# Patient Record
Sex: Male | Born: 1970 | Race: White | Hispanic: No | Marital: Married | Smoking: Never smoker
Health system: Southern US, Community
[De-identification: ages and names within clinical notes are randomized; demographics above are authoritative.]

## PROBLEM LIST (undated history)

## (undated) DIAGNOSIS — F419 Anxiety disorder, unspecified: Secondary | ICD-10-CM

## (undated) DIAGNOSIS — G43909 Migraine, unspecified, not intractable, without status migrainosus: Secondary | ICD-10-CM

## (undated) DIAGNOSIS — R519 Headache, unspecified: Secondary | ICD-10-CM

## (undated) DIAGNOSIS — M1990 Unspecified osteoarthritis, unspecified site: Secondary | ICD-10-CM

## (undated) DIAGNOSIS — K219 Gastro-esophageal reflux disease without esophagitis: Secondary | ICD-10-CM

## (undated) DIAGNOSIS — I10 Essential (primary) hypertension: Secondary | ICD-10-CM

## (undated) DIAGNOSIS — F32A Depression, unspecified: Secondary | ICD-10-CM

## (undated) DIAGNOSIS — R51 Headache: Secondary | ICD-10-CM

## (undated) DIAGNOSIS — J45909 Unspecified asthma, uncomplicated: Secondary | ICD-10-CM

## (undated) DIAGNOSIS — F329 Major depressive disorder, single episode, unspecified: Secondary | ICD-10-CM

## (undated) HISTORY — PX: TOE SURGERY: SHX1073

## (undated) HISTORY — PX: OTHER SURGICAL HISTORY: SHX169

## (undated) HISTORY — PX: FINGER SURGERY: SHX640

## (undated) NOTE — ED Notes (Signed)
 Formatting of this note might be different from the original. Camellia Search, PA at bedside for suturing.  Electronically signed by Hildegard Rake, RN at 06/21/2016 10:55 PM EDT

## (undated) NOTE — ED Notes (Signed)
 Formatting of this note might be different from the original. Pt soaking hand in hibiclens .  Electronically signed by Hildegard Rake, RN at 06/21/2016 10:36 PM EDT

## (undated) NOTE — ED Provider Notes (Signed)
 Formatting of this note is different from the original. eMERGENCY dEPARTMENT eNCOUnter    CHIEF COMPLAINT   Chief Complaint  Patient presents with  ? Thumb Injury Minor    Patient presents to ED with injury to right thumb. Pt was working on [employer] and thumb got caught between lift and steel beam. Pt UTD on tetanus in 2016.   HPI   Cody Huang is a 28 y.o. male who presents with complaints of injury to the right thumb. Patient was at work when a hydraulic lift pinched his right thumb.  He immediately noted bleeding and loss of partial nail and distal tip.  Bleeding was controlled with pressure and he presents to the emergency for further evaluation. He states that his tetanus is up-to-date. He has had full movement. Denies any hand pain.  He rates his pain 10 out of 10.  PAST MEDICAL HISTORY   Past Medical History  Diagnosis Date  ? Hypertension    SURGICAL HISTORY   History reviewed. No pertinent past surgical history.  CURRENT MEDICATIONS   No current facility-administered medications for this encounter.   Current Outpatient Prescriptions  Medication Sig Dispense Refill  ? ibuprofen (ADVIL,MOTRIN) 600 MG tablet Take 600 mg by mouth every 6 (six) hours as needed for Pain.    ? LORazepam (ATIVAN) 0.5 MG tablet Take 0.5 mg by mouth every 6 (six) hours as needed for Anxiety.    ? metoprolol  (LOPRESSOR ) 100 MG tablet Take 100 mg by mouth 2 (two) times daily.    ? traMADol  (ULTRAM ) 50 mg tablet Take 50 mg by mouth every 6 (six) hours as needed for Pain.    ? cephALEXin (KEFLEX) 500 MG capsule Take 1 capsule (500 mg total) by mouth 3 (three) times daily. 21 capsule 0  ? oxyCODONE -acetaminophen  (PERCOCET) 5-325 mg per tablet Take 1 tablet by mouth every 4 (four) hours as needed for Pain. 15 tablet 0   ALLERGIES   Allergies  Allergen Reactions  ? Penicillins Hives  ? Aspirin Rash   FAMILY HISTORY   History reviewed. No pertinent family history.  SOCIAL HISTORY   Social  History   Social History  ? Marital Status: Married    Spouse Name: N/A  ? Number of Children: N/A  ? Years of Education: N/A   Social History Main Topics  ? Smoking status: Never Smoker   ? Smokeless tobacco: None  ? Alcohol Use: Yes  ? Drug Use: No  ? Sexual Activity: Not Asked   Other Topics Concern  ? None   Social History Narrative  ? None   REVIEW OF SYSTEMS   Constitutional:  Denies fever, chills.  Respiratory:  Denies cough or shortness of breath.  Cardiovascular:  Denies chest pain.  GI:  Denies abdominal pain, nausea, vomiting.  Denies diarrhea Skin:  See HPI  Neurologic:  Denies focal weakness or sensory changes.  Denies headache. Lymphatic:  Denies swollen glands.   See HPI for further details.  PHYSICAL EXAM   VITAL SIGNS ED Triage Vitals  Temp 06/21/16 2016 99.2 F (37.3 C)  Temp src 06/21/16 2016 Oral  Pulse 06/21/16 2016 108  Heart Rate Source --   Resp 06/21/16 2016 18  BP 06/21/16 2015 151/94 mmHg  BP Location 06/21/16 2016 Left upper arm  BP Method 06/21/16 2016 Automatic  SpO2 06/21/16 2015 98 %  O2 Device / Respiratory Support 06/21/16 2016 None (Room air)  O2 Flow Rate (L/min) --   Pain Assessment 06/21/16  2020 0-10  Pain Score 06/21/16 2020 10-Worst   BP 131/99 mmHg  Pulse 105  Temp(Src) 98.1 F (36.7 C) (Oral)  Resp 18  Ht 6' 3 (1.905 m)  Wt 252 lb (114.306 kg)  BMI 31.50 kg/m2  SpO2 100% Constitutional:  Well developed, well nourished, no acute distress, non-toxic appearance.   HENT:  Normocephalic, atraumatic, nose normal.  Bilateral external ears normal, oropharynx moist, no oral exudates.  Neck:  Normal range of motion, no tenderness, supple, no stridor.  Lymphatic:  No lymphadenopathy. Respiratory:  Normal breath sounds, no respiratory distress, no wheezing, no chest tenderness.  Cardiovascular:  Normal heart rate, normal rhythm, no murmurs, no rubs, no gallops.  Extremities:  Intact distal pulses.  Good range of motion in  all major joints.  No edema.   Back: No midline or CVA tenderness.  Skin:  Right thumb with partial avulsion of the nail. J shaped Laceration extending from distal tip to and across width of nail bed.  Partial avulsion/partial amputation of most distal tip.  No exposure of bone.  Imaging X-ray Finger Right Minimum 2 Views  06/21/2016  3 views right thumb FINDINGS: Soft tissue injury seen to the distal aspect of the thumb. No radiopaque foreign objects. The bones and joint spaces remain intact. Dictated By: Elspeth ONEIDA Cleveland, MD 06/21/2016 8:46 PM Electronically Signed by: Elspeth ONEIDA Cleveland, MD 06/21/2016 8:46 PM  PROCEDURES   Patient Name: Cody Huang  Medical Record Number: [id number] Date: 06/21/16  Room/Bed: N INTK C/N INTK C  Indication: Laceration/Nail bed injury  Procedure: The patient was placed in the appropriate position and anesthesia around the laceration was obtained with a full digital block of the right thumb using 1% Lidocaine  without epinephrine. The area was irrigated with high pressure normal saline and explored with no foreign bodies discovered, then cleansed with betadine and draped in a sterile fashion.  The laceration was closed with 5-0 vicryl using interrupted sutures. There were no additional lacerations requiring repair. The wound area was then dressed with non adherent and gauze.  The patient?s tetanus status was up to date and did not require a booster dose.  Total repaired wound length: 2 cm.   Other Items: None  The patient tolerated the procedure well.  Complications: None  Camellia Search PA-C  ED COURSE & MEDICAL DECISION MAKING   52 year old with crush like injury to right thumb with partial amputation and nail avulsion.  After digital block was performed remaining nail was removed to allow closure of nail bed laceration.  No fractures noted. Patient was placed on Keflex prophylactically and provided pain medication. He is from out of town. I advised that he  follow with his PCP on return for evaluation and likely will need to see a hand surgeon.  Patient stable for discharge.  Usual and customary return precautions discussed.  FINAL IMPRESSION   Final diagnoses:  Fingertip amputation, initial encounter  Laceration of nail bed of finger, initial encounter  Nail avulsion, finger, initial encounter   Camellia Search, PA-C   Camellia Search, PA-C 06/22/16 0109 Cosigned by Franky FORBES Sieve, MD at 06/22/2016  1:16 AM EDT Electronically signed by Camellia Search, PA-C at 06/22/2016  1:09 AM EDT Electronically signed by Franky FORBES Sieve, MD at 06/22/2016  1:16 AM EDT

---

## 1998-06-21 ENCOUNTER — Ambulatory Visit (HOSPITAL_BASED_OUTPATIENT_CLINIC_OR_DEPARTMENT_OTHER): Admission: RE | Admit: 1998-06-21 | Discharge: 1998-06-21 | Payer: Self-pay | Admitting: Orthopedic Surgery

## 1998-12-23 HISTORY — PX: FINGER SURGERY: SHX640

## 2004-12-23 HISTORY — PX: TOE SURGERY: SHX1073

## 2006-02-11 ENCOUNTER — Ambulatory Visit: Payer: Self-pay | Admitting: Internal Medicine

## 2006-08-15 ENCOUNTER — Ambulatory Visit: Payer: Self-pay | Admitting: Internal Medicine

## 2007-04-23 ENCOUNTER — Ambulatory Visit: Payer: Self-pay | Admitting: Oncology

## 2007-05-15 ENCOUNTER — Ambulatory Visit: Payer: Self-pay | Admitting: Oncology

## 2007-05-24 ENCOUNTER — Ambulatory Visit: Payer: Self-pay | Admitting: Oncology

## 2007-06-23 ENCOUNTER — Ambulatory Visit: Payer: Self-pay | Admitting: Oncology

## 2009-07-08 ENCOUNTER — Emergency Department: Payer: Self-pay | Admitting: Emergency Medicine

## 2009-07-27 IMAGING — CT CT HEAD WITHOUT CONTRAST
2 series · 16 of 30 positions shown, 20 images · non-contrast
Comparison: none

REASON FOR EXAM: 4 wk hx of headache and 1 wk hx of fever
COMMENTS:

[Series 2: without · axial · non-contrast · 0.44mm/px · z∈[+102,+238]mm · 13 of 33 slices shown, 17 images]
[im 3/33  brain]
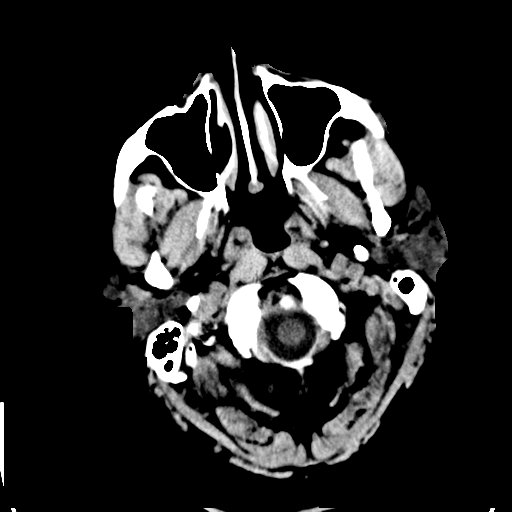
[im 3/33  bone]
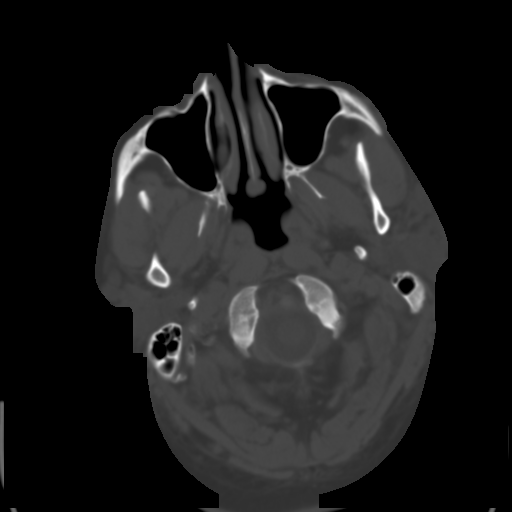
[im 5/33  brain]
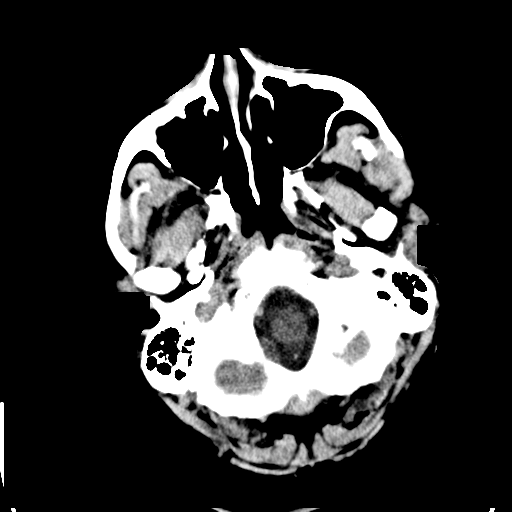
[im 7/33  brain]
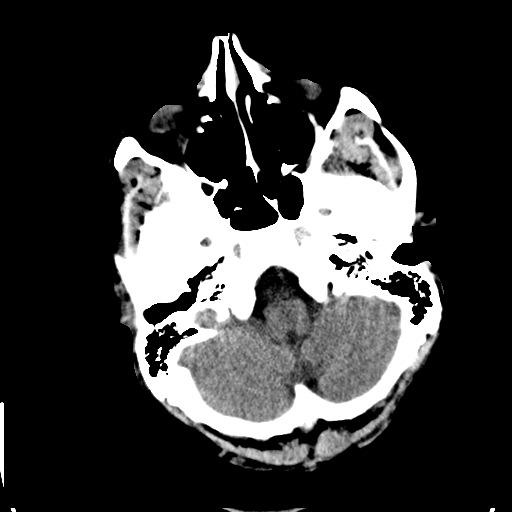
[im 10/33  brain]
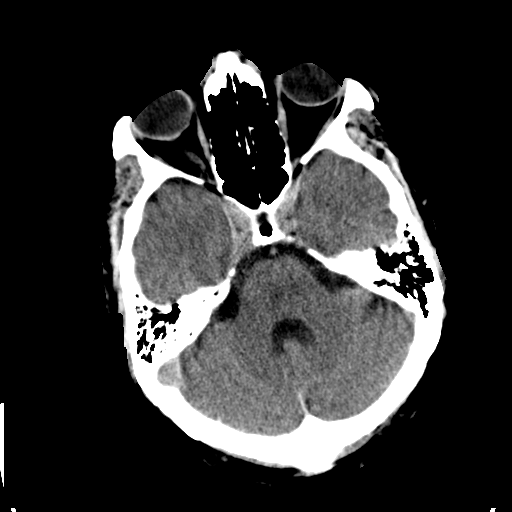
[im 12/33  brain]
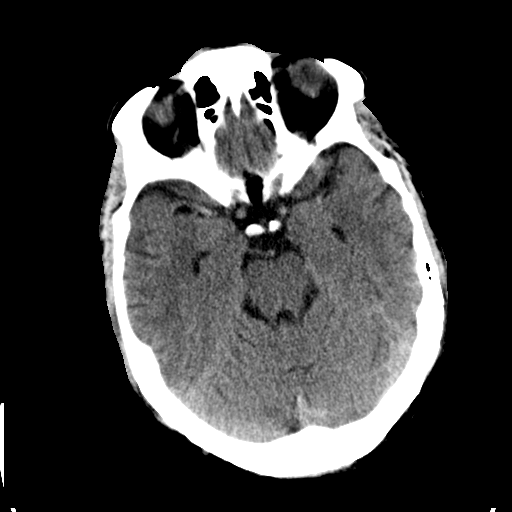
[im 12/33  bone]
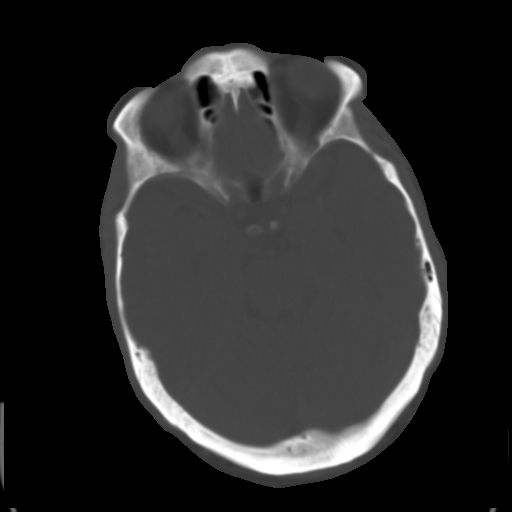
[im 14/33  brain]
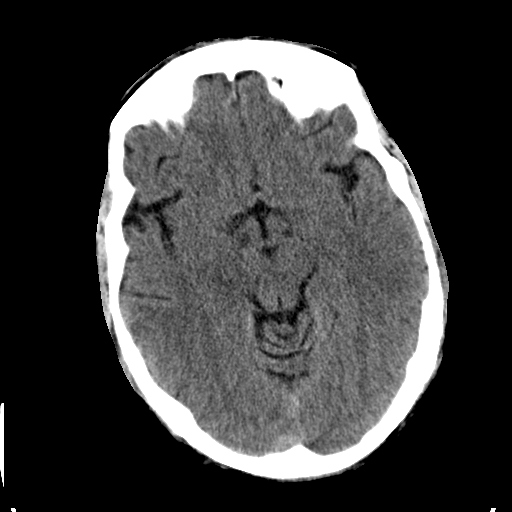
[im 17/33  brain]
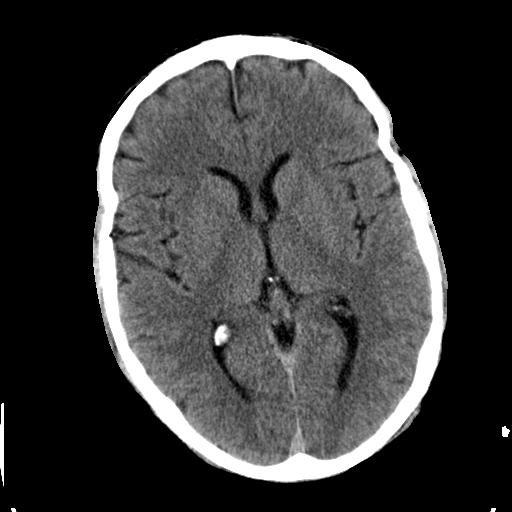
[im 19/33  brain]
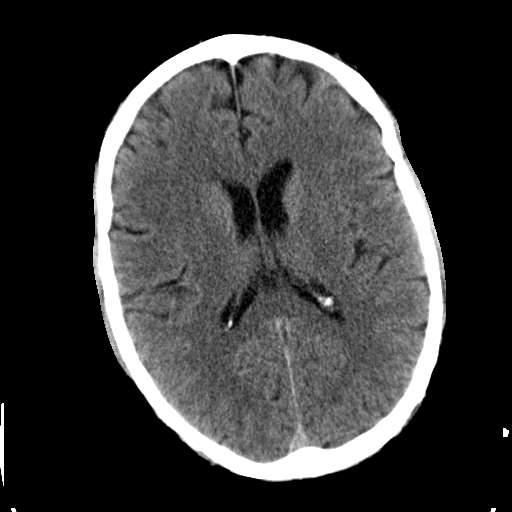
[im 21/33  brain]
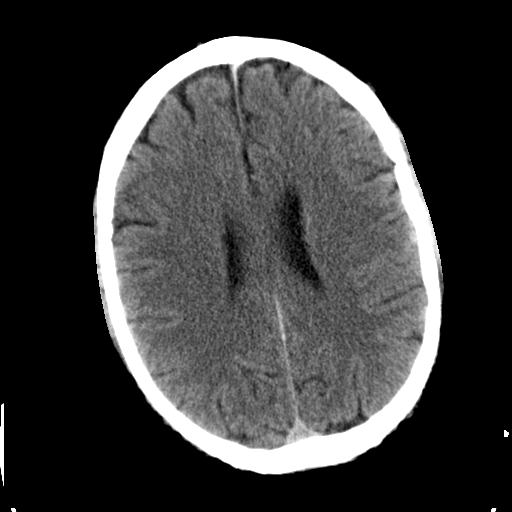
[im 21/33  bone]
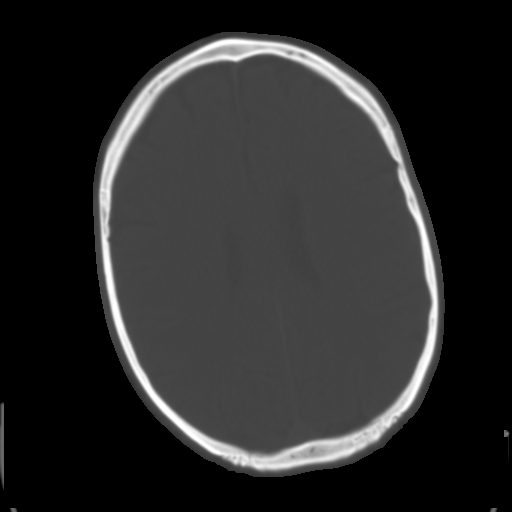
[im 23/33  brain]
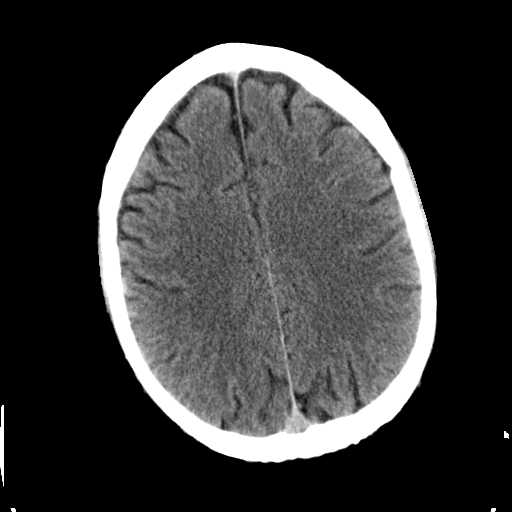
[im 26/33  brain]
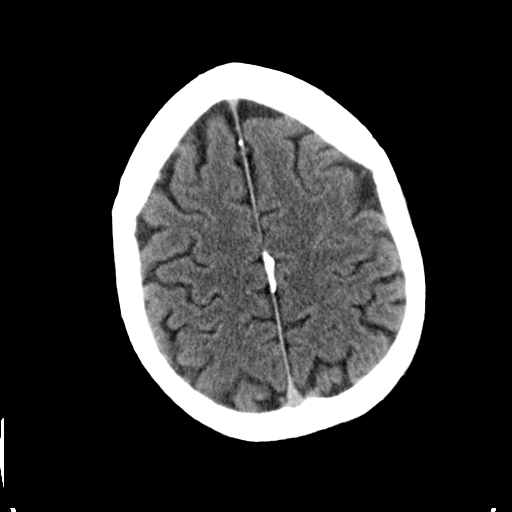
[im 28/33  brain]
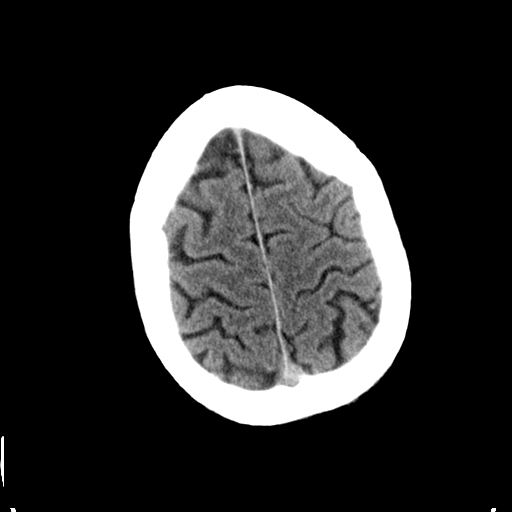
[im 30/33  brain]
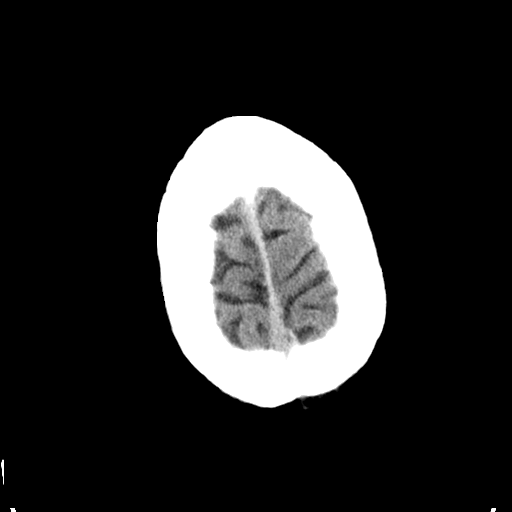
[im 30/33  bone]
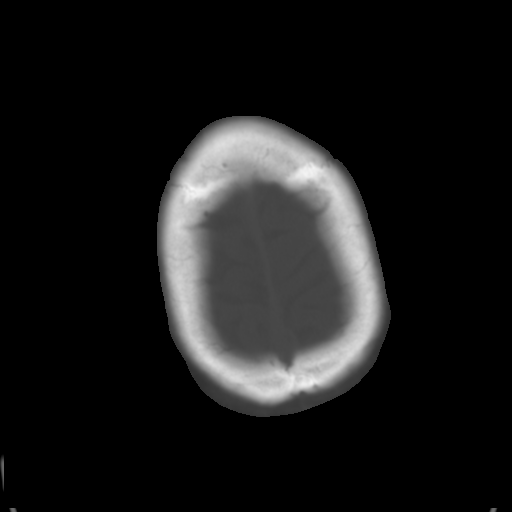

[Series 3: bone · axial · 0.44mm/px · z∈[+102,+148]mm · 3 of 33 slices shown]
[im 3/33  bone]
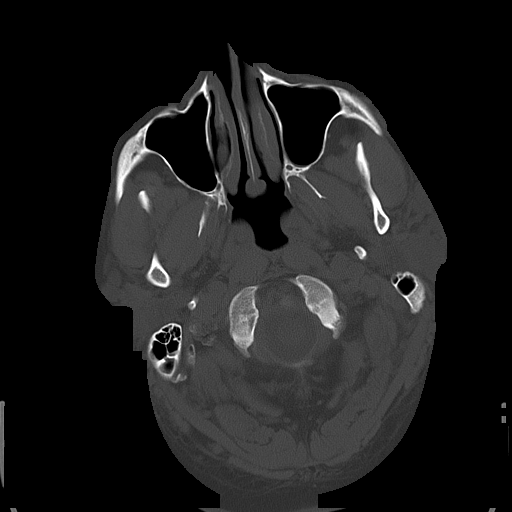
[im 7/33  bone]
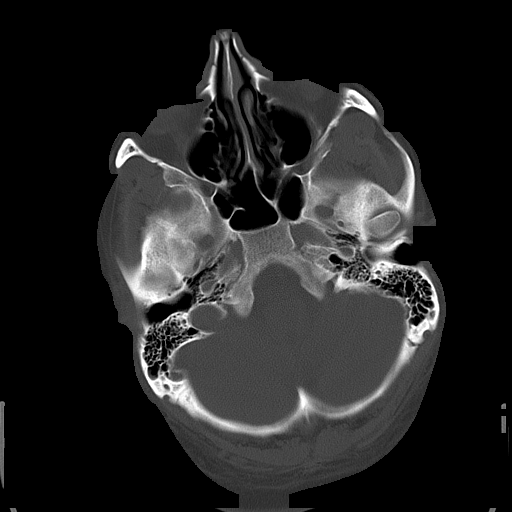
[im 12/33  bone]
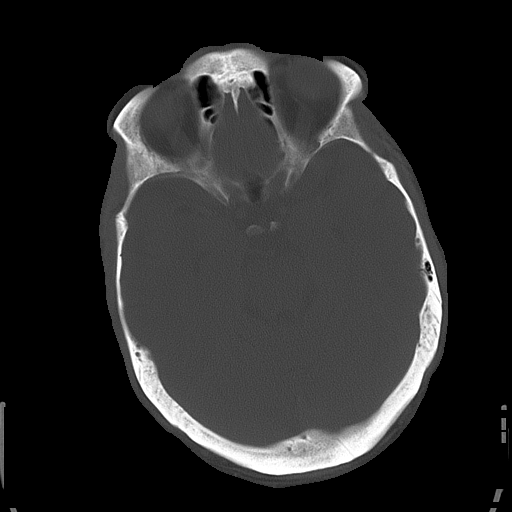

[16 of 30 positions shown; findings below may reference images not displayed]

PROCEDURE:     CT  - CT HEAD WITHOUT CONTRAST  - July 08, 2009  [DATE]

RESULT:        There is no evidence of intra-axial or extra-axial fluid
collections or evidence of acute hemorrhage. No secondary signs are
appreciated to suggest mass effect, subacute or chronic territorial
infarction. The visualized osseous structures demonstrate no evidence of
fracture or dislocation.
IMPRESSION: 1.     No evidence of focal or acute abnormalities.
2.     Adenyo Ologo, PA of the Emergency Department was informed of these
findings at the time of the initial interpretation.

## 2014-07-17 DIAGNOSIS — G8929 Other chronic pain: Secondary | ICD-10-CM | POA: Insufficient documentation

## 2014-07-17 DIAGNOSIS — I10 Essential (primary) hypertension: Secondary | ICD-10-CM | POA: Insufficient documentation

## 2014-10-09 DIAGNOSIS — F325 Major depressive disorder, single episode, in full remission: Secondary | ICD-10-CM | POA: Insufficient documentation

## 2015-12-24 HISTORY — PX: OTHER SURGICAL HISTORY: SHX169

## 2016-01-29 ENCOUNTER — Ambulatory Visit
Admission: EM | Admit: 2016-01-29 | Discharge: 2016-01-29 | Disposition: A | Discharge status: Home / Self Care | Payer: BLUE CROSS/BLUE SHIELD | Attending: Family Medicine | Admitting: Family Medicine

## 2016-01-29 ENCOUNTER — Ambulatory Visit (INDEPENDENT_AMBULATORY_CARE_PROVIDER_SITE_OTHER): Payer: BLUE CROSS/BLUE SHIELD

## 2016-01-29 DIAGNOSIS — J301 Allergic rhinitis due to pollen: Secondary | ICD-10-CM

## 2016-01-29 DIAGNOSIS — J4531 Mild persistent asthma with (acute) exacerbation: Secondary | ICD-10-CM

## 2016-01-29 DIAGNOSIS — H6593 Unspecified nonsuppurative otitis media, bilateral: Secondary | ICD-10-CM

## 2016-01-29 DIAGNOSIS — J45909 Unspecified asthma, uncomplicated: Secondary | ICD-10-CM

## 2016-01-29 DIAGNOSIS — J069 Acute upper respiratory infection, unspecified: Secondary | ICD-10-CM

## 2016-01-29 DIAGNOSIS — J209 Acute bronchitis, unspecified: Secondary | ICD-10-CM

## 2016-01-29 HISTORY — DX: Essential (primary) hypertension: I10

## 2016-01-29 LAB — RAPID INFLUENZA A&B ANTIGENS (ARMC ONLY)
INFLUENZA A (ARMC): NOT DETECTED
INFLUENZA B (ARMC): NOT DETECTED

## 2016-01-29 MED ORDER — ALBUTEROL SULFATE HFA 108 (90 BASE) MCG/ACT IN AERS: 1.0000 | INHALATION_SPRAY | Freq: Four times a day (QID) | RESPIRATORY_TRACT | Status: AC | PRN

## 2016-01-29 MED ORDER — AZITHROMYCIN 250 MG PO TABS: 250.0000 mg | ORAL_TABLET | Freq: Every day | ORAL | Status: DC

## 2016-01-29 MED ORDER — LORATADINE 10 MG PO TABS: 10.0000 mg | ORAL_TABLET | Freq: Every day | ORAL | Status: DC

## 2016-01-29 MED ORDER — BENZONATATE 200 MG PO CAPS: 200.0000 mg | ORAL_CAPSULE | Freq: Three times a day (TID) | ORAL | Status: DC | PRN

## 2016-01-29 MED ORDER — SALINE SPRAY 0.65 % NA SOLN: 2.0000 | NASAL | Status: DC

## 2016-01-29 MED ORDER — PREDNISONE 50 MG PO TABS: 50.0000 mg | ORAL_TABLET | Freq: Every day | ORAL | Status: AC

## 2016-01-29 MED ORDER — GUAIFENESIN-CODEINE 100-10 MG/5ML PO SYRP: 5.0000 mL | ORAL_SOLUTION | Freq: Every evening | ORAL | Status: AC | PRN

## 2016-01-29 NOTE — Discharge Instructions (Signed)
Asthma Attack Prevention °While you may not be able to control the fact that you have asthma, you can take actions to prevent asthma attacks. The best way to prevent asthma attacks is to maintain good control of your asthma. You can achieve this by: °· Taking your medicines as directed. °· Avoiding things that can irritate your airways or make your asthma symptoms worse (asthma triggers). °· Keeping track of how well your asthma is controlled and of any changes in your symptoms. °· Responding quickly to worsening asthma symptoms (asthma attack). °· Seeking emergency care when it is needed. °WHAT ARE SOME WAYS TO PREVENT AN ASTHMA ATTACK? °Have a Plan °Work with your health care provider to create a written plan for managing and treating your asthma attacks (asthma action plan). This plan includes: °· A list of your asthma triggers and how you can avoid them. °· Information on when medicines should be taken and when their dosages should be changed. °· The use of a device that measures how well your lungs are working (peak flow meter). °Monitor Your Asthma °Use your peak flow meter and record your results in a journal every day. A drop in your peak flow numbers on one or more days may indicate the start of an asthma attack. This can happen even before you start to feel symptoms. You can prevent an asthma attack from getting worse by following the steps in your asthma action plan. °Avoid Asthma Triggers °Work with your asthma health care provider to find out what your asthma triggers are. This can be done by: °· Allergy testing. °· Keeping a journal that notes when asthma attacks occur and the factors that may have contributed to them. °· Determining if there are other medical conditions that are making your asthma worse. °Once you have determined your asthma triggers, take steps to avoid them. This may include avoiding excessive or prolonged exposure to: °· Dust. Have someone dust and vacuum your home for you once or  twice a week. Using a high-efficiency particulate arrestance (HEPA) vacuum is best. °· Smoke. This includes campfire smoke, forest fire smoke, and secondhand smoke from tobacco products. °· Pet dander. Avoid contact with animals that you know you are allergic to. °· Allergens from trees, grasses or pollens. Avoid spending a lot of time outdoors when pollen counts are high, and on very windy days. °· Very cold, dry, or humid air. °· Mold. °· Foods that contain high amounts of sulfites. °· Strong odors. °· Outdoor air pollutants, such as engine exhaust. °· Indoor air pollutants, such as aerosol sprays and fumes from household cleaners. °· Household pests, including dust mites and cockroaches, and pest droppings. °· Certain medicines, including NSAIDs. Always talk to your health care provider before stopping or starting any new medicines. °Medicines °Take over-the-counter and prescription medicines only as told by your health care provider. Many asthma attacks can be prevented by carefully following your medicine schedule. Taking your medicines correctly is especially important when you cannot avoid certain asthma triggers. °Act Quickly °If an asthma attack does happen, acting quickly can decrease how severe it is and how long it lasts. Take these steps:  °· Pay attention to your symptoms. If you are coughing, wheezing, or having difficulty breathing, do not wait to see if your symptoms go away on their own. Follow your asthma action plan. °· If you have followed your asthma action plan and your symptoms are not improving, call your health care provider or seek immediate medical care   at the nearest hospital. It is important to note how often you need to use your fast-acting rescue inhaler. If you are using your rescue inhaler more often, it may mean that your asthma is not under control. Adjusting your asthma treatment plan may help you to prevent future asthma attacks and help you to gain better control of your  condition. HOW CAN I PREVENT AN ASTHMA ATTACK WHEN I EXERCISE? Follow advice from your health care provider about whether you should use your fast-acting inhaler before exercising. Many people with asthma experience exercise-induced bronchoconstriction (EIB). This condition often worsens during vigorous exercise in cold, humid, or dry environments. Usually, people with EIB can stay very active by pre-treating with a fast-acting inhaler before exercising.   This information is not intended to replace advice given to you by your health care provider. Make sure you discuss any questions you have with your health care provider.   Document Released: 11/27/2009 Document Revised: 08/30/2015 Document Reviewed: 05/11/2015 Elsevier Interactive Patient Education 2016 [employer] Metered Dose Inhaler (No Spacer Used) Inhaled medicines are the basis of treatment for asthma and other breathing problems. Inhaled medicine can only be effective if used properly. Good technique assures that the medicine reaches the lungs. Metered dose inhalers (MDIs) are used to deliver a variety of inhaled medicines. These include quick relief or rescue medicines (such as bronchodilators) and controller medicines (such as corticosteroids). The medicine is delivered by pushing down on a metal canister to release a set amount of spray. If you are using different kinds of inhalers, use your quick relief medicine to open the airways 10-15 minutes before using a steroid, if instructed to do so by your health care provider. If you are unsure which inhalers to use and the order of using them, ask your health care provider, nurse, or respiratory therapist. HOW TO USE THE INHALER  Remove the cap from the inhaler.  If you are using the inhaler for the first time, you will need to prime it. Shake the inhaler for 5 seconds and release four puffs into the air, away from your face. Ask your health care provider or pharmacist if you have  questions about priming your inhaler.  Shake the inhaler for 5 seconds before each breath in (inhalation).  Position the inhaler so that the top of the canister faces up.  Put your index finger on the top of the medicine canister. Your thumb supports the bottom of the inhaler.  Open your mouth.  Either place the inhaler between your teeth and place your lips tightly around the mouthpiece, or hold the inhaler 1-2 inches away from your open mouth. If you are unsure of which technique to use, ask your health care provider.  Breathe out (exhale) normally and as completely as possible.  Press the canister down with the index finger to release the medicine.  At the same time as the canister is pressed, inhale deeply and slowly until your lungs are completely filled. This should take 4-6 seconds. Keep your tongue down.  Hold the medicine in your lungs for 5-10 seconds (10 seconds is best). This helps the medicine get into the small airways of your lungs.  Breathe out slowly, through pursed lips. Whistling is an example of pursed lips.  Wait at least 1 minute between puffs. Continue with the above steps until you have taken the number of puffs your health care provider has ordered. Do not use the inhaler more than your health care provider directs you to.  Replace the cap on the inhaler.  Follow the directions from your health care provider or the inhaler insert for cleaning the inhaler. If you are using a steroid inhaler, after your last puff, rinse your mouth with water, gargle, and spit out the water. Do not swallow the water. AVOID:  Inhaling before or after starting the spray of medicine. It takes practice to coordinate your breathing with triggering the spray.  Inhaling through the nose (rather than the mouth) when triggering the spray. HOW TO DETERMINE IF YOUR INHALER IS FULL OR NEARLY EMPTY You cannot know when an inhaler is empty by shaking it. Some inhalers are now being made with  dose counters. Ask your health care provider for a prescription that has a dose counter if you feel you need that extra help. If your inhaler does not have a counter, ask your health care provider to help you determine the date you need to refill your inhaler. Write the refill date on a calendar or your inhaler canister. Refill your inhaler 7-10 days before it runs out. Be sure to keep an adequate supply of medicine. This includes making sure it has not expired, and making sure you have a spare inhaler. SEEK MEDICAL CARE IF:  Symptoms are only partially relieved with your inhaler.  You are having trouble using your inhaler.  You experience an increase in phlegm. SEEK IMMEDIATE MEDICAL CARE IF:  You feel little or no relief with your inhalers. You are still wheezing and feeling shortness of breath, tightness in your chest, or both.  You have dizziness, headaches, or a fast heart rate.  You have chills, fever, or night sweats.  There is a noticeable increase in phlegm production, or there is blood in the phlegm. MAKE SURE YOU:  Understand these instructions.  Will watch your condition.  Will get help right away if you are not doing well or get worse.   This information is not intended to replace advice given to you by your health care provider. Make sure you discuss any questions you have with your health care provider.   Document Released: 10/06/2007 Document Revised: 12/30/2014 Document Reviewed: 05/27/2013 Elsevier Interactive Patient Education 2016 [employer].  Allergic Rhinitis Allergic rhinitis is when the mucous membranes in the nose respond to allergens. Allergens are particles in the air that cause your body to have an allergic reaction. This causes you to release allergic antibodies. Through a chain of events, these eventually cause you to release histamine into the blood stream. Although meant to protect the body, it is this release of histamine that causes your discomfort,  such as frequent sneezing, congestion, and an itchy, runny nose.  CAUSES Seasonal allergic rhinitis (hay fever) is caused by pollen allergens that may come from grasses, trees, and weeds. Year-round allergic rhinitis (perennial allergic rhinitis) is caused by allergens such as house dust mites, pet dander, and mold spores. SYMPTOMS  Nasal stuffiness (congestion).  Itchy, runny nose with sneezing and tearing of the eyes. DIAGNOSIS Your health care provider can help you determine the allergen or allergens that trigger your symptoms. If you and your health care provider are unable to determine the allergen, skin or blood testing may be used. Your health care provider will diagnose your condition after taking your health history and performing a physical exam. Your health care provider may assess you for other related conditions, such as asthma, pink eye, or an ear infection. TREATMENT Allergic rhinitis does not have a cure, but it can be  controlled by:  Medicines that block allergy symptoms. These may include allergy shots, nasal sprays, and oral antihistamines.  Avoiding the allergen. Hay fever may often be treated with antihistamines in pill or nasal spray forms. Antihistamines block the effects of histamine. There are over-the-counter medicines that may help with nasal congestion and swelling around the eyes. Check with your health care provider before taking or giving this medicine. If avoiding the allergen or the medicine prescribed do not work, there are many new medicines your health care provider can prescribe. Stronger medicine may be used if initial measures are ineffective. Desensitizing injections can be used if medicine and avoidance does not work. Desensitization is when a patient is given ongoing shots until the body becomes less sensitive to the allergen. Make sure you follow up with your health care provider if problems continue. HOME CARE INSTRUCTIONS It is not possible to  completely avoid allergens, but you can reduce your symptoms by taking steps to limit your exposure to them. It helps to know exactly what you are allergic to so that you can avoid your specific triggers. SEEK MEDICAL CARE IF:  You have a fever.  You develop a cough that does not stop easily (persistent).  You have shortness of breath.  You start wheezing.  Symptoms interfere with normal daily activities.   This information is not intended to replace advice given to you by your health care provider. Make sure you discuss any questions you have with your health care provider.   Document Released: 09/03/2001 Document Revised: 12/30/2014 Document Reviewed: 08/16/2013 Elsevier Interactive Patient Education 2016 [employer] Inc. Upper Respiratory Infection, Adult Most upper respiratory infections (URIs) are a viral infection of the air passages leading to the lungs. A URI affects the nose, throat, and upper air passages. The most common type of URI is nasopharyngitis and is typically referred to as "the common cold." URIs run their course and usually go away on their own. Most of the time, a URI does not require medical attention, but sometimes a bacterial infection in the upper airways can follow a viral infection. This is called a secondary infection. Sinus and middle ear infections are common types of secondary upper respiratory infections. Bacterial pneumonia can also complicate a URI. A URI can worsen asthma and chronic obstructive pulmonary disease (COPD). Sometimes, these complications can require emergency medical care and may be life threatening.  CAUSES Almost all URIs are caused by viruses. A virus is a type of germ and can spread from one person to another.  RISKS FACTORS You may be at risk for a URI if:   You smoke.   You have chronic heart or lung disease.  You have a weakened defense (immune) system.   You are very young or very old.   You have nasal allergies or  asthma.  You work in crowded or poorly ventilated areas.  You work in health care facilities or schools. SIGNS AND SYMPTOMS  Symptoms typically develop 2-3 days after you come in contact with a cold virus. Most viral URIs last 7-10 days. However, viral URIs from the influenza virus (flu virus) can last 14-18 days and are typically more severe. Symptoms may include:   Runny or stuffy (congested) nose.   Sneezing.   Cough.   Sore throat.   Headache.   Fatigue.   Fever.   Loss of appetite.   Pain in your forehead, behind your eyes, and over your cheekbones (sinus pain).  Muscle aches.  DIAGNOSIS  Your  health care provider may diagnose a URI by:  Physical exam.  Tests to check that your symptoms are not due to another condition such as:  Strep throat.  Sinusitis.  Pneumonia.  Asthma. TREATMENT  A URI goes away on its own with time. It cannot be cured with medicines, but medicines may be prescribed or recommended to relieve symptoms. Medicines may help:  Reduce your fever.  Reduce your cough.  Relieve nasal congestion. HOME CARE INSTRUCTIONS   Take medicines only as directed by your health care provider.   Gargle warm saltwater or take cough drops to comfort your throat as directed by your health care provider.  Use a warm mist humidifier or inhale steam from a shower to increase air moisture. This may make it easier to breathe.  Drink enough fluid to keep your urine clear or pale yellow.   Eat soups and other clear broths and maintain good nutrition.   Rest as needed.   Return to work when your temperature has returned to normal or as your health care provider advises. You may need to stay home longer to avoid infecting others. You can also use a face mask and careful hand washing to prevent spread of the virus.  Increase the usage of your inhaler if you have asthma.   Do not use any tobacco products, including cigarettes, chewing tobacco,  or electronic cigarettes. If you need help quitting, ask your health care provider. PREVENTION  The best way to protect yourself from getting a cold is to practice good hygiene.   Avoid oral or hand contact with people with cold symptoms.   Wash your hands often if contact occurs.  There is no clear evidence that vitamin C, vitamin E, echinacea, or exercise reduces the chance of developing a cold. However, it is always recommended to get plenty of rest, exercise, and practice good nutrition.  SEEK MEDICAL CARE IF:   You are getting worse rather than better.   Your symptoms are not controlled by medicine.   You have chills.  You have worsening shortness of breath.  You have brown or red mucus.  You have yellow or brown nasal discharge.  You have pain in your face, especially when you bend forward.  You have a fever.  You have swollen neck glands.  You have pain while swallowing.  You have white areas in the back of your throat. SEEK IMMEDIATE MEDICAL CARE IF:   You have severe or persistent:  Headache.  Ear pain.  Sinus pain.  Chest pain.  You have chronic lung disease and any of the following:  Wheezing.  Prolonged cough.  Coughing up blood.  A change in your usual mucus.  You have a stiff neck.  You have changes in your:  Vision.  Hearing.  Thinking.  Mood. MAKE SURE YOU:   Understand these instructions.  Will watch your condition.  Will get help right away if you are not doing well or get worse.   This information is not intended to replace advice given to you by your health care provider. Make sure you discuss any questions you have with your health care provider.   Document Released: 06/04/2001 Document Revised: 04/25/2015 Document Reviewed: 03/16/2014 Elsevier Interactive Patient Education 2016 Else[employer] Inc. Otitis Media With Effusion Otitis media with effusion is the presence of fluid in the middle ear. This is a common problem  in children, which often follows ear infections. It may be present for weeks or longer after the infection.  Unlike an acute ear infection, otitis media with effusion refers only to fluid behind the ear drum and not infection. Children with repeated ear and sinus infections and allergy problems are the most likely to get otitis media with effusion. CAUSES  The most frequent cause of the fluid buildup is dysfunction of the eustachian tubes. These are the tubes that drain fluid in the ears to the back of the nose (nasopharynx). SYMPTOMS   The main symptom of this condition is hearing loss. As a result, you or your child may:  Listen to the TV at a loud volume.  Not respond to questions.  Ask "what" often when spoken to.  Mistake or confuse one sound or word for another.  There may be a sensation of fullness or pressure but usually not pain. DIAGNOSIS   Your health care provider will diagnose this condition by examining you or your child's ears.  Your health care provider may test the pressure in you or your child's ear with a tympanometer.  A hearing test may be conducted if the problem persists. TREATMENT   Treatment depends on the duration and the effects of the effusion.  Antibiotics, decongestants, nose drops, and cortisone-type drugs (tablets or nasal spray) may not be helpful.  Children with persistent ear effusions may have delayed language or behavioral problems. Children at risk for developmental delays in hearing, learning, and speech may require referral to a specialist earlier than children not at risk.  You or your child's health care provider may suggest a referral to an ear, nose, and throat surgeon for treatment. The following may help restore normal hearing:  Drainage of fluid.  Placement of ear tubes (tympanostomy tubes).  Removal of adenoids (adenoidectomy). HOME CARE INSTRUCTIONS   Avoid secondhand smoke.  Infants who are breastfed are less likely to have  this condition.  Avoid feeding infants while they are lying flat.  Avoid known environmental allergens.  Avoid people who are sick. SEEK MEDICAL CARE IF:   Hearing is not better in 3 months.  Hearing is worse.  Ear pain.  Drainage from the ear.  Dizziness. MAKE SURE YOU:   Understand these instructions.  Will watch your condition.  Will get help right away if you are not doing well or get worse.   This information is not intended to replace advice given to you by your health care provider. Make sure you discuss any questions you have with your health care provider.   Document Released: 01/16/2005 Document Revised: 12/30/2014 Document Reviewed: 07/06/2013 Elsevier Interactive Patient Education 2016 Else[employer] Inc. Acute Bronchitis Bronchitis is inflammation of the airways that extend from the windpipe into the lungs (bronchi). The inflammation often causes mucus to develop. This leads to a cough, which is the most common symptom of bronchitis.  In acute bronchitis, the condition usually develops suddenly and goes away over time, usually in a couple weeks. Smoking, allergies, and asthma can make bronchitis worse. Repeated episodes of bronchitis may cause further lung problems.  CAUSES Acute bronchitis is most often caused by the same virus that causes a cold. The virus can spread from person to person (contagious) through coughing, sneezing, and touching contaminated objects. SIGNS AND SYMPTOMS   Cough.   Fever.   Coughing up mucus.   Body aches.   Chest congestion.   Chills.   Shortness of breath.   Sore throat.  DIAGNOSIS  Acute bronchitis is usually diagnosed through a physical exam. Your health care provider will also ask you questions  about your medical history. Tests, such as chest X-rays, are sometimes done to rule out other conditions.  TREATMENT  Acute bronchitis usually goes away in a couple weeks. Oftentimes, no medical treatment is necessary.  Medicines are sometimes given for relief of fever or cough. Antibiotic medicines are usually not needed but may be prescribed in certain situations. In some cases, an inhaler may be recommended to help reduce shortness of breath and control the cough. A cool mist vaporizer may also be used to help thin bronchial secretions and make it easier to clear the chest.  HOME CARE INSTRUCTIONS  Get plenty of rest.   Drink enough fluids to keep your urine clear or pale yellow (unless you have a medical condition that requires fluid restriction). Increasing fluids may help thin your respiratory secretions (sputum) and reduce chest congestion, and it will prevent dehydration.   Take medicines only as directed by your health care provider.  If you were prescribed an antibiotic medicine, finish it all even if you start to feel better.  Avoid smoking and secondhand smoke. Exposure to cigarette smoke or irritating chemicals will make bronchitis worse. If you are a smoker, consider using nicotine gum or skin patches to help control withdrawal symptoms. Quitting smoking will help your lungs heal faster.   Reduce the chances of another bout of acute bronchitis by washing your hands frequently, avoiding people with cold symptoms, and trying not to touch your hands to your mouth, nose, or eyes.   Keep all follow-up visits as directed by your health care provider.  SEEK MEDICAL CARE IF: Your symptoms do not improve after 1 week of treatment.  SEEK IMMEDIATE MEDICAL CARE IF:  You develop an increased fever or chills.   You have chest pain.   You have severe shortness of breath.  You have bloody sputum.   You develop dehydration.  You faint or repeatedly feel like you are going to pass out.  You develop repeated vomiting.  You develop a severe headache. MAKE SURE YOU:   Understand these instructions.  Will watch your condition.  Will get help right away if you are not doing well or get  worse.   This information is not intended to replace advice given to you by your health care provider. Make sure you discuss any questions you have with your health care provider.   Document Released: 01/16/2005 Document Revised: 12/30/2014 Document Reviewed: 06/01/2013 Elsevier Interactive Patient Education [employer]

## 2016-01-29 NOTE — ED Provider Notes (Signed)
CSN: [id number]     Arrival date & time 01/29/16  1703 History   First MD Initiated Contact with Patient 01/29/16 1829     Chief Complaint  Patient presents with  . Influenza   (Consider location/radiation/quality/duration/timing/severity/associated sxs/prior Treatment) HPI Comments: Married caucasian male here for evaluation fever 101.6, body aches, sob, wheezing, productive cough brown, rhinorrhea clear.  Has tried nyquil, dayquil, motrin, tylenol, mucinex  PMHx asthma, HTN ran out of metoprolol ready to pick up today.  Hasn't used albuterol none at home  Patient is a 45 y.o. male presenting with flu symptoms. The history is provided by the patient and the spouse.  Influenza Presenting symptoms: cough, fatigue, fever, headache, myalgias, rhinorrhea, shortness of breath and vomiting   Presenting symptoms: no diarrhea, no nausea and no sore throat   Cough:    Cough characteristics:  Productive   Sputum characteristics:  Rusty and brown   Severity:  Moderate   Onset quality:  Sudden   Timing:  Intermittent   Progression:  Worsening   Chronicity:  New Fatigue:    Severity:  Moderate   Duration:  3 days   Timing:  Constant   Progression:  Unchanged Fever:    Duration:  3 days   Timing:  Intermittent   Max temp PTA (F):  101.6   Temp source:  Oral Headaches:    Severity:  Moderate   Onset quality:  Sudden   Duration:  3 days   Timing:  Intermittent   Progression:  Resolved   Chronicity:  New Myalgias:    Location:  Generalized   Quality:  Aching   Duration:  3 days   Timing:  Constant   Progression:  Unchanged Rhinorrhea:    Quality:  Clear   Severity:  Mild   Duration:  3 days   Timing:  Intermittent   Progression:  Unchanged Shortness of breath:    Severity:  Moderate   Onset quality:  Sudden   Duration:  3 days   Timing:  Intermittent   Progression:  Partially resolved Vomiting:    Quality:  Stomach contents   Number of occurrences:  1   Severity:  Moderate    Duration:  1 day   Timing:  Intermittent   Progression:  Resolved Severity:  Moderate Onset quality:  Sudden Duration:  3 days Progression:  Unchanged Chronicity:  New Relieved by:  Nothing Worsened by:  Certain positions Ineffective treatments:  Certain positions, drinking, rest and OTC medications Associated symptoms: chills, decreased appetite, ear pain and nasal congestion   Associated symptoms: no decrease in physical activity, no mental status change, no neck stiffness and no witnessed syncope   Ear pain:    Location:  Bilateral   Severity:  Moderate   Onset quality:  Sudden   Duration:  3 days   Timing:  Constant   Progression:  Unchanged   Chronicity:  New Risk factors: sick contacts   Risk factors: not elderly, no diabetes problem, no heart disease, no immunocompromised state, no kidney disease, no liver disease and not pregnant     Past Medical History  Diagnosis Date  . Hypertension    Past Surgical History  Procedure Laterality Date  . Finger surgery    . Toe surgery     History reviewed. No pertinent family history. Social History  Substance Use Topics  . Smoking status: Former Games developer  . Smokeless tobacco: None  . Alcohol Use: Yes     Comment: socially  Review of Systems  Constitutional: Positive for fever, chills, diaphoresis, activity change, appetite change, fatigue and decreased appetite.  HENT: Positive for congestion, ear pain, postnasal drip, rhinorrhea and voice change. Negative for dental problem, drooling, ear discharge, facial swelling, hearing loss, mouth sores, nosebleeds, sinus pressure, sneezing, sore throat and tinnitus.   Eyes: Positive for pain. Negative for photophobia, discharge, redness, itching and visual disturbance.  Respiratory: Positive for cough, shortness of breath and wheezing. Negative for stridor.   Cardiovascular: Negative for chest pain, palpitations and leg swelling.  Gastrointestinal: Positive for vomiting. Negative  for nausea, abdominal pain, diarrhea, constipation, blood in stool and abdominal distention.  Endocrine: Negative for cold intolerance and heat intolerance.  Genitourinary: Negative for dysuria.  Musculoskeletal: Positive for myalgias. Negative for joint swelling, gait problem and neck stiffness.  Skin: Negative for color change, pallor, rash and wound.  Allergic/Immunologic: Positive for environmental allergies. Negative for food allergies.  Neurological: Positive for headaches.  Hematological: Negative for adenopathy. Does not bruise/bleed easily.  Psychiatric/Behavioral: Negative for behavioral problems, confusion, sleep disturbance and agitation.    Allergies  Penicillins and Aspirin  Home Medications   Prior to Admission medications   Medication Sig Start Date End Date Taking? Authorizing Provider  metoprolol succinate (TOPROL-XL) 50 MG 24 hr tablet Take 50 mg by mouth daily. Take with or immediately following a meal.   Yes Historical Provider, MD  albuterol (PROVENTIL HFA;VENTOLIN HFA) 108 (90 Base) MCG/ACT inhaler Inhale 1-2 puffs into the lungs every 6 (six) hours as needed for wheezing or shortness of breath. 01/29/16   Barbaraann Barthel, NP  azithromycin (ZITHROMAX) 250 MG tablet Take 1 tablet (250 mg total) by mouth daily. Take first 2 tablets together, then 1 every day until finished. 01/29/16   Barbaraann Barthel, NP  benzonatate (TESSALON) 200 MG capsule Take 1 capsule (200 mg total) by mouth 3 (three) times daily as needed for cough. 01/29/16   Barbaraann Barthel, NP  guaiFENesin-codeine (ROBITUSSIN AC) 100-10 MG/5ML syrup Take 5 mLs by mouth at bedtime as needed for cough. 01/29/16 02/04/16  Barbaraann Barthel, NP  loratadine (CLARITIN) 10 MG tablet Take 1 tablet (10 mg total) by mouth daily. 01/29/16   Barbaraann Barthel, NP  predniSONE (DELTASONE) 50 MG tablet Take 1 tablet (50 mg total) by mouth daily with breakfast. 01/30/16 02/03/16  Barbaraann Barthel, NP  sodium chloride (OCEAN) 0.65 %  SOLN nasal spray Place 2 sprays into both nostrils every 2 (two) hours while awake. 01/29/16   Barbaraann Barthel, NP   Meds Ordered and Administered this Visit  Medications - No data to display  BP 143/107 mmHg  Pulse 100  Temp(Src) 98.2 F (36.8 C) (Tympanic)  Resp 17  Ht 6\' 1"  (1.854 m)  Wt 253 lb (114.76 kg)  BMI 33.39 kg/m2  SpO2 99% No data found.   Physical Exam  Constitutional: He is oriented to person, place, and time. Vital signs are normal. He appears well-developed and well-nourished. He is active and cooperative.  Non-toxic appearance. He does not have a sickly appearance. He appears ill. No distress.  HENT:  Head: Normocephalic and atraumatic.  Right Ear: Hearing, external ear and ear canal normal. A middle ear effusion is present.  Left Ear: Hearing, external ear and ear canal normal. A middle ear effusion is present.  Nose: Mucosal edema and rhinorrhea present. No nose lacerations, sinus tenderness, nasal deformity, septal deviation or nasal septal hematoma. No epistaxis.  No foreign bodies. Right sinus  exhibits no maxillary sinus tenderness and no frontal sinus tenderness. Left sinus exhibits no maxillary sinus tenderness and no frontal sinus tenderness.  Mouth/Throat: Uvula is midline and mucous membranes are normal. Mucous membranes are not pale, not dry and not cyanotic. He does not have dentures. No oral lesions. No trismus in the jaw. Normal dentition. No dental abscesses, uvula swelling, lacerations or dental caries. Posterior oropharyngeal edema and posterior oropharyngeal erythema present. No oropharyngeal exudate or tonsillar abscesses.  Cobblestoning posterior pharynx; bilateral TMs with air fluid level; bilateral nasal turbinates with yellow discharge  Eyes: Conjunctivae, EOM and lids are normal. Pupils are equal, round, and reactive to light. Right eye exhibits no chemosis, no discharge, no exudate and no hordeolum. No foreign body present in the right eye. Left  eye exhibits no chemosis, no discharge, no exudate and no hordeolum. No foreign body present in the left eye. Right conjunctiva is not injected. Right conjunctiva has no hemorrhage. Left conjunctiva is not injected. Left conjunctiva has no hemorrhage. No scleral icterus. Right eye exhibits normal extraocular motion and no nystagmus. Left eye exhibits normal extraocular motion and no nystagmus. Right pupil is round and reactive. Left pupil is round and reactive. Pupils are equal.  Neck: Trachea normal and normal range of motion. Neck supple. No tracheal tenderness, no spinous process tenderness and no muscular tenderness present. No rigidity. No tracheal deviation, no edema, no erythema and normal range of motion present. No thyroid mass and no thyromegaly present.  Cardiovascular: Normal rate, regular rhythm, S1 normal, S2 normal, normal heart sounds and intact distal pulses.  PMI is not displaced.  Exam reveals no gallop and no friction rub.   No murmur heard. Pulmonary/Chest: Effort normal and breath sounds normal. No stridor. No respiratory distress. He has no decreased breath sounds. He has no wheezes. He has no rhonchi. He has no rales.  Cough with inspiration able to speak in sentences  Abdominal: Soft. He exhibits no distension.  Musculoskeletal: Normal range of motion. He exhibits no edema or tenderness.       Right shoulder: Normal.       Left shoulder: Normal.       Right elbow: Normal.      Left elbow: Normal.       Right hip: Normal.       Left hip: Normal.       Right knee: Normal.       Left knee: Normal.       Cervical back: Normal.       Right hand: Normal.       Left hand: Normal.  Lymphadenopathy:       Head (right side): No submental, no submandibular, no tonsillar, no preauricular, no posterior auricular and no occipital adenopathy present.       Head (left side): No submental, no submandibular, no tonsillar, no preauricular, no posterior auricular and no occipital adenopathy  present.    He has no cervical adenopathy.       Right cervical: No superficial cervical, no deep cervical and no posterior cervical adenopathy present.      Left cervical: No superficial cervical, no deep cervical and no posterior cervical adenopathy present.  Neurological: He is alert and oriented to person, place, and time. He displays no atrophy and no tremor. No cranial nerve deficit or sensory deficit. He exhibits normal muscle tone. He displays no seizure activity. Coordination and gait normal. GCS eye subscore is 4. GCS verbal subscore is 5. GCS motor subscore is  6.  Skin: Skin is warm, dry and intact. No abrasion, no bruising, no burn, no ecchymosis, no laceration, no lesion, no petechiae and no rash noted. He is not diaphoretic. No cyanosis or erythema. No pallor. Nails show no clubbing.  Psychiatric: He has a normal mood and affect. His speech is normal and behavior is normal. Judgment and thought content normal. Cognition and memory are normal.  Nursing note and vitals reviewed.   ED Course  Procedures (including critical care time)  Labs Review Labs Reviewed  RAPID INFLUENZA A&B ANTIGENS [facility] ONLY)    Imaging Review Dg Chest 2 View  01/29/2016  CLINICAL DATA:  Chest tightness sore throat shortness of breath with exertion, productive cough and fever for 4 days EXAM: CHEST  2 VIEW COMPARISON:  None. FINDINGS: The heart size and mediastinal contours are within normal limits. Both lungs are clear. The visualized skeletal structures are unremarkable. IMPRESSION: No active cardiopulmonary disease. Electronically Signed   By: Esperanza Heir M.D.   On: 01/29/2016 18:52   1915 notified flu negative rapid A&B, chest xray without fluid or pneumonia.  Given copy of radiology report.  Patient and spouse verbalized understanding of information/instructions, agreed with plan of care.   MDM   1. Asthma exacerbation, non-allergic, mild persistent   2. Allergic rhinitis due to pollen   3.  Bronchitis with asthma, acute   4. Otitis media with effusion, bilateral   5. Viral URI    Patient may use normal saline nasal spray as needed.  Consider antihistamine or nasal steroid use. Restart claritin  po daily consider adding flonase 1 spray each nostril bid if still having congestion/post nasal drip. Avoid triggers if possible.  Shower prior to bedtime if exposed to triggers.  If allergic dust/dust mites recommend mattress/pillow covers/encasements; washing linens, vacuuming, sweeping, dusting weekly.  Call or return to clinic as needed if these symptoms worsen or fail to improve as anticipated.   Exitcare handout on allergic rhinitis given to patient.  Patient verbalized understanding of instructions, agreed with plan of care and had no further questions at this time.  P2:  Avoidance and hand washing.  Prednisone  po daily with breakfast if no relief with zpak.  Start albuterol 1-2 puffs po q6h for 72 hours.  Bronchitis simple, community acquired, may have started as viral (probably respiratory syncytial, parainfluenza, influenza, or adenovirus), but now evidence of acute purulent bronchitis with resultant bronchial edema and mucus formation.  Differential Diagnoses:  Reactive Airway Disease (asthma, allergic aspergillosis eosinophilia), chronic bronchitis, respiratory infection (sinusitis, common cold, pneumonia), congestive heart failure, smoke/irritant exposure, reflux esophagitis, bronchogenic tumor, and/or aspiration syndromes.  I will give Zithromax for five days for possible Mycoplasma.  will hold on CBC at this time.  I discussed that approximately 50% of patients with acute bronchitis have a cough that lasts up to three weeks, and 25% for over a month. Tylenol, one to two tablets every four hours as needed for fever or myalgias.   No aspirin. Patient instructed to follow up in one week or sooner if symptoms worsen. exitcare handout on bronchitis given to patient.  Patient verbalized  agreement and understanding of treatment plan.  P2:  hand washing and cover cough  Refilled albuterol as expired hasn't used in past year.  Medications as directed.  Patient is to return to the clinic or follow up with PCM if there is increased wheezing or shortness of breath, increased use of albuterol.  Patient educated on the importance of continuing  to monitor their peak flows.  Patient given Exitcare handout on asthma exacerbation/how to prevent attacks.  Patient verbalized agreement and understanding of treatment plan.  P2:  Asthma action plan, fluids, and fitness  Supportive treatment.   No evidence of invasive bacterial infection, non toxic and well hydrated.  This is most likely self limiting viral infection.  I do not see where any further testing or imaging is necessary at this time.   I will suggest supportive care, rest, good hygiene and encourage the patient to take adequate fluids.  The patient is to return to clinic or EMERGENCY ROOM if symptoms worsen or change significantly e.g. ear pain, fever, purulent discharge from ears or bleeding.  Exitcare handout on otitis media with effusion given to patient.  Patient verbalized agreement and understanding of treatment plan.    Patient notified rapid flu negative.  Suspect Viral illness: no evidence of invasive bacterial infection, non toxic and well hydrated.  This is most likely self limiting viral infection.  I do not see where any further testing or imaging is necessary at this time.   I will suggest supportive care, rest, good hygiene and encourage the patient to take adequate fluids.  24 hour work excuse given claritin 10mg  po daily; flonase 1 spray each nostril BID prn, nasal saline 1-2 sprays each nostril prn q2h, tylenol 1000mg  po QID prn.  Discussed honey with lemon and salt water gargles for comfort also.  The patient is to return to clinic or EMERGENCY ROOM if symptoms worsen or change significantly e.g. fever, lethargy, SOB, wheezing.   Exitcare handout on viral illness given to patient.  Patient verbalized agreement and understanding of treatment plan.    Restart metoprolol succinate pick up from [facility] today.  Continue to monitor blood pressure at home and maintain log of blood pressure and pulse to bring to follow up appointments.  Continue low sodium diet and exercise program.  Recommended weight loss/weight maintenance to BMI 20-25.  Return to the clinic if any new symptoms.  Patient verbalized agreement and understanding of treatment plan and had no further questions at this time.   P2:  Diet and Exercise specific for HTN  Barbaraann Barthel, NP 01/29/16 1925

## 2016-01-29 NOTE — ED Notes (Signed)
Started Friday with sore throat and Saturday generalized body aches, cough, runny nose and fever

## 2016-02-17 IMAGING — CR DG CHEST 2V
2 series · 2 of 2 positions shown · non-contrast
Comparison: None.

CLINICAL DATA: Chest tightness sore throat shortness of breath with
exertion, productive cough and fever for 4 days

EXAM:
CHEST  2 VIEW

[chest pa]
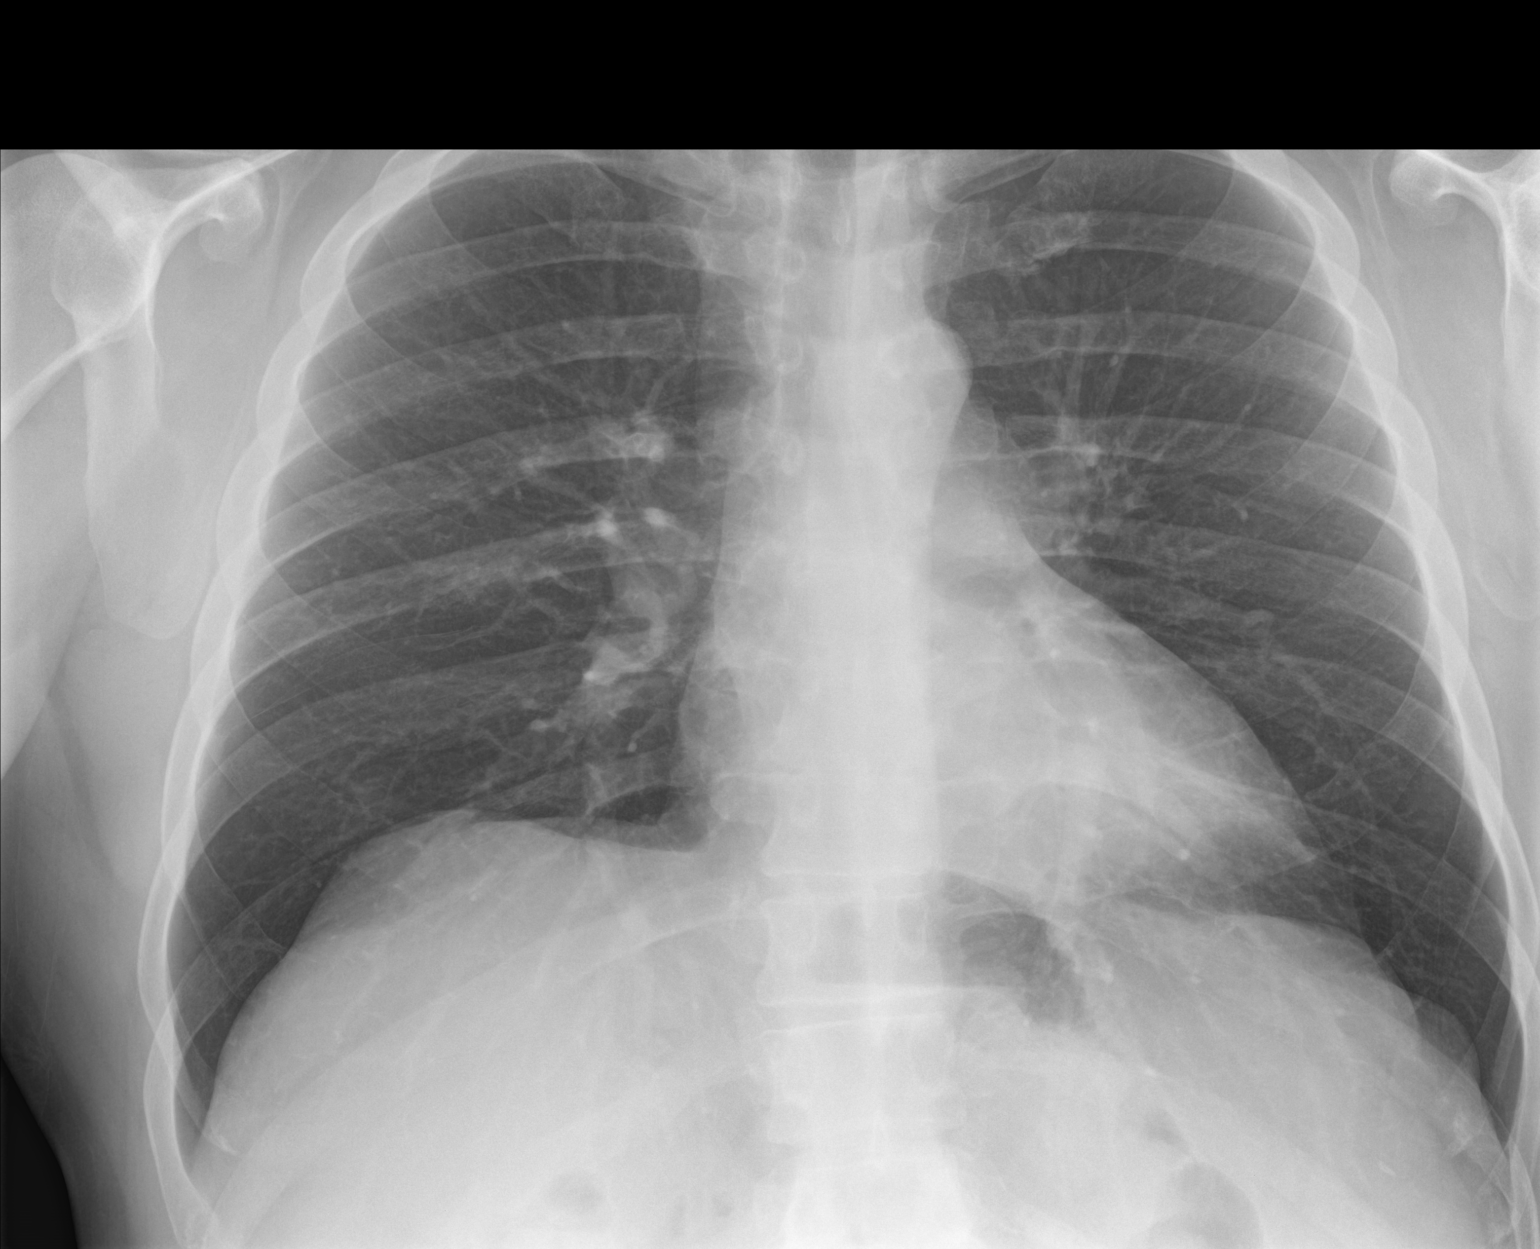

[chest lat]
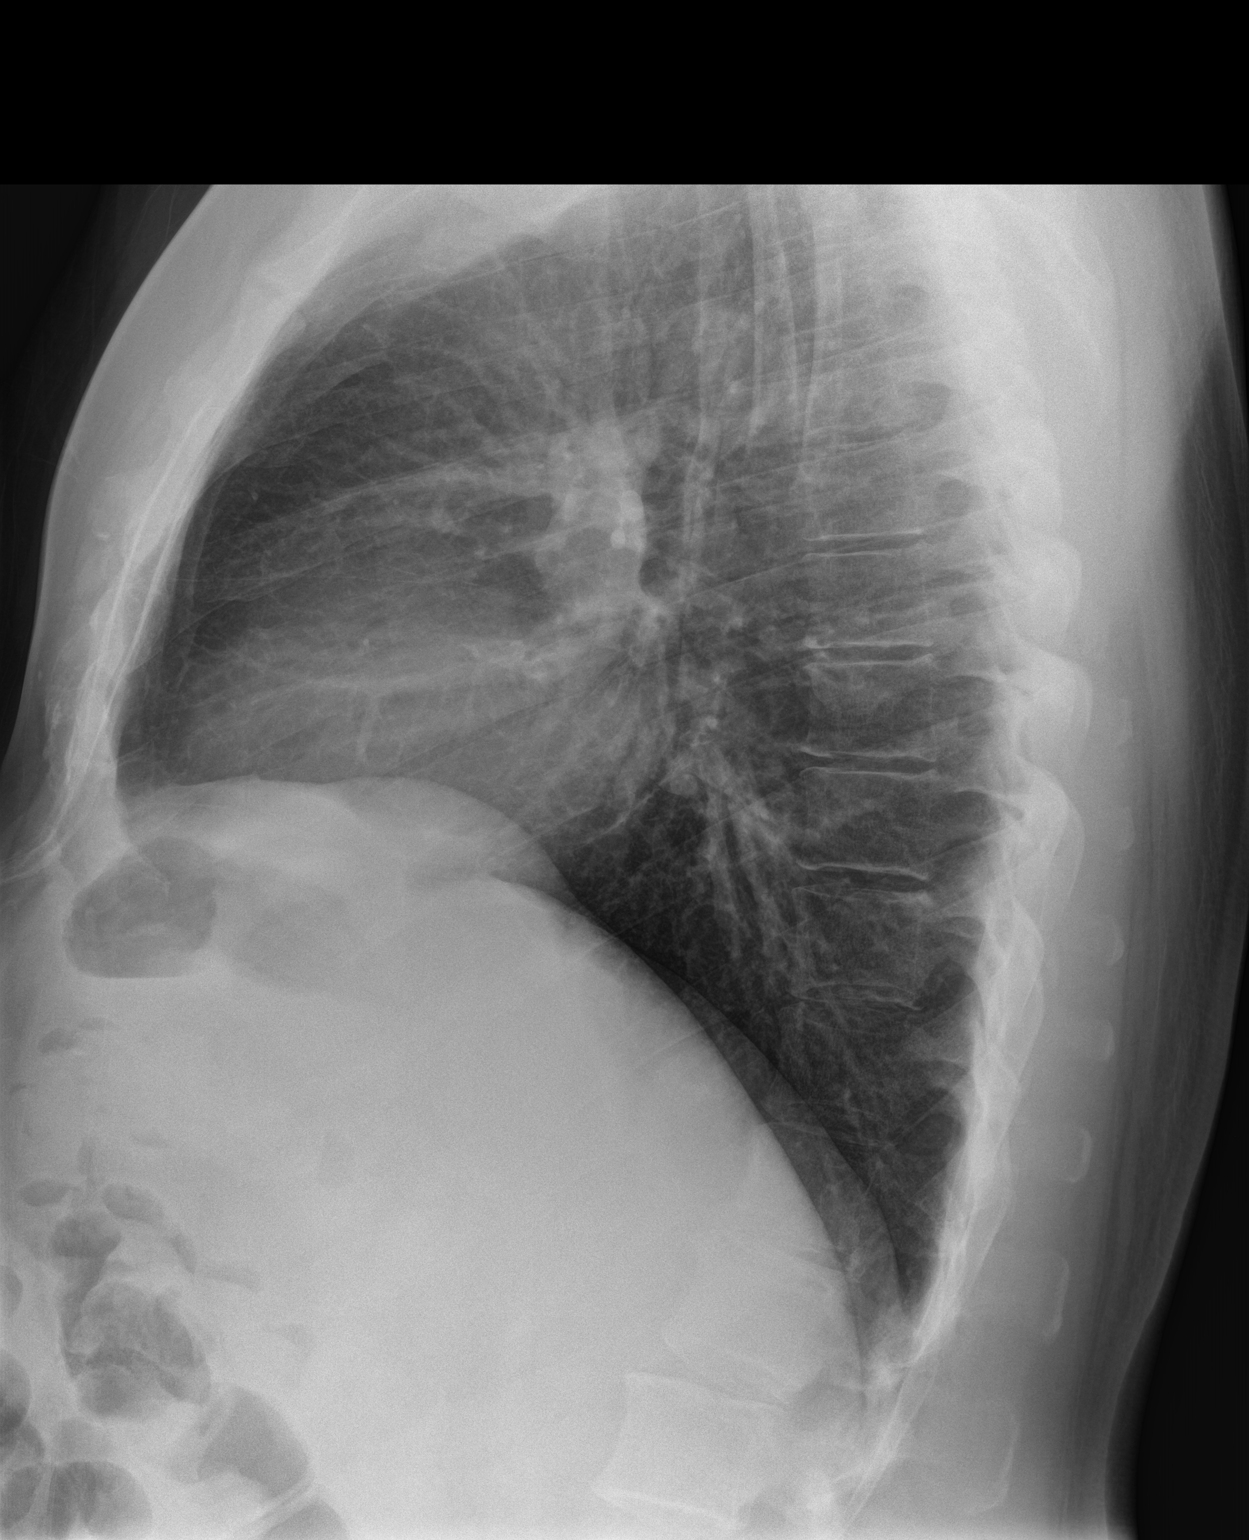

[2 of 2 positions shown; findings below may reference images not displayed]

FINDINGS: The heart size and mediastinal contours are within normal limits.
Both lungs are clear. The visualized skeletal structures are
unremarkable.
IMPRESSION: No active cardiopulmonary disease.

## 2018-05-04 DIAGNOSIS — M1711 Unilateral primary osteoarthritis, right knee: Secondary | ICD-10-CM | POA: Insufficient documentation

## 2018-05-04 DIAGNOSIS — M1712 Unilateral primary osteoarthritis, left knee: Secondary | ICD-10-CM | POA: Insufficient documentation

## 2018-05-04 DIAGNOSIS — E669 Obesity, unspecified: Secondary | ICD-10-CM | POA: Insufficient documentation

## 2018-06-03 ENCOUNTER — Other Ambulatory Visit: Payer: Self-pay | Admitting: Orthopedic Surgery

## 2018-06-03 DIAGNOSIS — M2392 Unspecified internal derangement of left knee: Secondary | ICD-10-CM

## 2018-06-03 DIAGNOSIS — M2352 Chronic instability of knee, left knee: Secondary | ICD-10-CM

## 2018-06-03 DIAGNOSIS — M25462 Effusion, left knee: Secondary | ICD-10-CM

## 2018-06-03 DIAGNOSIS — M1712 Unilateral primary osteoarthritis, left knee: Secondary | ICD-10-CM

## 2018-06-15 ENCOUNTER — Ambulatory Visit
Admission: RE | Admit: 2018-06-15 | Discharge: 2018-06-15 | Disposition: A | Discharge status: Home / Self Care | Payer: BLUE CROSS/BLUE SHIELD | Source: Ambulatory Visit | Attending: Orthopedic Surgery | Admitting: Orthopedic Surgery

## 2018-06-15 DIAGNOSIS — M2392 Unspecified internal derangement of left knee: Secondary | ICD-10-CM

## 2018-06-15 DIAGNOSIS — M25462 Effusion, left knee: Secondary | ICD-10-CM

## 2018-06-15 DIAGNOSIS — M1712 Unilateral primary osteoarthritis, left knee: Secondary | ICD-10-CM

## 2018-06-15 DIAGNOSIS — M2352 Chronic instability of knee, left knee: Secondary | ICD-10-CM

## 2018-07-14 ENCOUNTER — Other Ambulatory Visit: Payer: Self-pay

## 2018-07-14 ENCOUNTER — Encounter
Admission: RE | Admit: 2018-07-14 | Discharge: 2018-07-14 | Disposition: A | Discharge status: Home / Self Care | Payer: BLUE CROSS/BLUE SHIELD | Source: Ambulatory Visit | Attending: Unknown Physician Specialty | Admitting: Unknown Physician Specialty

## 2018-07-14 HISTORY — DX: Gastro-esophageal reflux disease without esophagitis: K21.9

## 2018-07-14 HISTORY — DX: Headache: R51

## 2018-07-14 HISTORY — DX: Major depressive disorder, single episode, unspecified: F32.9

## 2018-07-14 HISTORY — DX: Anxiety disorder, unspecified: F41.9

## 2018-07-14 HISTORY — DX: Unspecified asthma, uncomplicated: J45.909

## 2018-07-14 HISTORY — DX: Unspecified osteoarthritis, unspecified site: M19.90

## 2018-07-14 HISTORY — DX: Depression, unspecified: F32.A

## 2018-07-14 HISTORY — DX: Headache, unspecified: R51.9

## 2018-07-14 NOTE — Patient Instructions (Signed)
Your procedure is scheduled on: 07-15-18  Report to Same Day Surgery 2nd floor medical mall [facility] Entrance-take elevator on left to 2nd floor.  Check in with surgery information desk.) @ 6 AM PER PT   Remember: Instructions that are not followed completely may result in serious medical risk, up to and including death, or upon the discretion of your surgeon and anesthesiologist your surgery may need to be rescheduled.    _x___ 1. Do not eat food after midnight the night before your procedure. You may drink clear liquids up to 2 hours before you are scheduled to arrive at the hospital for your procedure.  Do not drink clear liquids within 2 hours of your scheduled arrival to the hospital.  Clear liquids include  --Water or Apple juice without pulp  --Clear carbohydrate beverage such as ClearFast or Gatorade  --Black Coffee or Clear Tea (No milk, no creamers, do not add anything to the coffee or Tea   No gum chewing or hard candies.     __x__ 2. No Alcohol for 24 hours before or after surgery.   __x__3. No Smoking or e-cigarettes for 24 prior to surgery.  Do not use any chewable tobacco products for at least 6 hour prior to surgery   ____  4. Bring all medications with you on the day of surgery if instructed.    __x__ 5. Notify your doctor if there is any change in your medical condition     (cold, fever, infections).    x___6. On the morning of surgery brush your teeth with toothpaste and water.  You may rinse your mouth with mouth wash if you wish.  Do not swallow any toothpaste or mouthwash.   Do not wear jewelry, make-up, hairpins, clips or nail polish.  Do not wear lotions, powders, or perfumes. You may wear deodorant.  Do not shave 48 hours prior to surgery. Men may shave face and neck.  Do not bring valuables to the hospital.    [facility] is not responsible for any belongings or valuables.               Contacts, dentures or bridgework may not be worn into  surgery.  Leave your suitcase in the car. After surgery it may be brought to your room.  For patients admitted to the hospital, discharge time is determined by your treatment team.  _  Patients discharged the day of surgery will not be allowed to drive home.  You will need someone to drive you home and stay with you the night of your procedure.    Please read over the following fact sheets that you were given:   [facility] Preparing for Surgery   _x___ TAKE THE FOLLOWING MEDICATION THE MORNING OF SURGERY WITH A SMALL SIP OF WATER. These include:  1. METOPROLOL  2. ZANTAC  3. TAKE A ZANTAC TONIGHT BEFORE BED  4.  5.  6.  ____Fleets enema or Magnesium Citrate as directed.   ____ Use CHG Soap or sage wipes as directed on instruction sheet   _X___ Use inhalers on the day of surgery and bring to hospital day of surgery-USE YOUR ALBUTEROL INHALER DAY OF SURGERY AND BRING TO HOSPITAL  ____ Stop Metformin and Janumet 2 days prior to surgery.    ____ Take 1/2 of usual insulin dose the night before surgery and none on the morning surgery.   ____ Follow recommendations from Cardiologist, Pulmonologist or PCP regarding stopping Aspirin, Coumadin, Plavix ,Eliquis, Effient,  or Pradaxa, and Pletal.  X____Stop Anti-inflammatories such as Advil, Aleve, Ibuprofen, Motrin, Naproxen, Naprosyn, Goodies powders or aspirin products NOW- OK to take Tylenol   ____ Stop supplements until after surgery.    ____ Bring C-Pap to the hospital.

## 2018-07-15 ENCOUNTER — Ambulatory Visit
Admission: RE | Admit: 2018-07-15 | Discharge: 2018-07-15 | Disposition: A | Discharge status: Home / Self Care | Payer: BLUE CROSS/BLUE SHIELD | Source: Ambulatory Visit | Attending: Unknown Physician Specialty | Admitting: Unknown Physician Specialty

## 2018-07-15 ENCOUNTER — Ambulatory Visit: Payer: BLUE CROSS/BLUE SHIELD

## 2018-07-15 ENCOUNTER — Encounter
Admission: RE | Disposition: A | Discharge status: Home / Self Care | Payer: Self-pay | Source: Ambulatory Visit | Attending: Unknown Physician Specialty

## 2018-07-15 ENCOUNTER — Encounter: Payer: Self-pay | Admitting: Anesthesiology

## 2018-07-15 DIAGNOSIS — M1712 Unilateral primary osteoarthritis, left knee: Secondary | ICD-10-CM | POA: Insufficient documentation

## 2018-07-15 DIAGNOSIS — F410 Panic disorder [episodic paroxysmal anxiety] without agoraphobia: Secondary | ICD-10-CM | POA: Diagnosis not present

## 2018-07-15 DIAGNOSIS — J45909 Unspecified asthma, uncomplicated: Secondary | ICD-10-CM | POA: Insufficient documentation

## 2018-07-15 DIAGNOSIS — M25862 Other specified joint disorders, left knee: Secondary | ICD-10-CM | POA: Diagnosis not present

## 2018-07-15 DIAGNOSIS — I10 Essential (primary) hypertension: Secondary | ICD-10-CM | POA: Insufficient documentation

## 2018-07-15 DIAGNOSIS — F329 Major depressive disorder, single episode, unspecified: Secondary | ICD-10-CM | POA: Diagnosis not present

## 2018-07-15 DIAGNOSIS — M23252 Derangement of posterior horn of lateral meniscus due to old tear or injury, left knee: Secondary | ICD-10-CM | POA: Insufficient documentation

## 2018-07-15 DIAGNOSIS — Z791 Long term (current) use of non-steroidal anti-inflammatories (NSAID): Secondary | ICD-10-CM | POA: Insufficient documentation

## 2018-07-15 DIAGNOSIS — K219 Gastro-esophageal reflux disease without esophagitis: Secondary | ICD-10-CM | POA: Diagnosis not present

## 2018-07-15 DIAGNOSIS — Z79899 Other long term (current) drug therapy: Secondary | ICD-10-CM | POA: Insufficient documentation

## 2018-07-15 HISTORY — PX: KNEE ARTHROSCOPY: SHX127

## 2018-07-15 SURGERY — ARTHROSCOPY, KNEE
Anesthesia: General | Site: Knee | Laterality: Left | Wound class: Clean

## 2018-07-15 MED ORDER — PROPOFOL 500 MG/50ML IV EMUL
INTRAVENOUS | Status: AC
  Filled 2018-07-15: qty 50

## 2018-07-15 MED ORDER — GLYCOPYRROLATE 0.2 MG/ML IJ SOLN
INTRAMUSCULAR | Status: AC
  Filled 2018-07-15: qty 1

## 2018-07-15 MED ORDER — PROPOFOL 10 MG/ML IV BOLUS
INTRAVENOUS | Status: AC
Start: 2018-07-15 — End: ?
  Filled 2018-07-15: qty 20

## 2018-07-15 MED ORDER — OXYCODONE HCL 5 MG/5ML PO SOLN: 5.0000 mg | Freq: Once | ORAL | Status: DC | PRN

## 2018-07-15 MED ORDER — LACTATED RINGERS IV SOLN
INTRAVENOUS | Status: DC
  Administered 2018-07-15 (×2): via INTRAVENOUS

## 2018-07-15 MED ORDER — BUPIVACAINE HCL (PF) 0.5 % IJ SOLN
INTRAMUSCULAR | Status: AC
  Filled 2018-07-15: qty 30

## 2018-07-15 MED ORDER — SUCCINYLCHOLINE CHLORIDE 20 MG/ML IJ SOLN
INTRAMUSCULAR | Status: AC
  Filled 2018-07-15: qty 1

## 2018-07-15 MED ORDER — SEVOFLURANE IN SOLN
RESPIRATORY_TRACT | Status: AC
  Filled 2018-07-15: qty 250

## 2018-07-15 MED ORDER — ONDANSETRON HCL 4 MG/2ML IJ SOLN
INTRAMUSCULAR | Status: AC
Start: 2018-07-15 — End: ?
  Filled 2018-07-15: qty 2

## 2018-07-15 MED ORDER — ROCURONIUM BROMIDE 50 MG/5ML IV SOLN
INTRAVENOUS | Status: AC
Start: 2018-07-15 — End: ?
  Filled 2018-07-15: qty 1

## 2018-07-15 MED ORDER — FENTANYL CITRATE (PF) 100 MCG/2ML IJ SOLN: 25.0000 ug | INTRAMUSCULAR | Status: DC | PRN

## 2018-07-15 MED ORDER — PROPOFOL 10 MG/ML IV BOLUS
INTRAVENOUS | Status: DC | PRN
  Administered 2018-07-15: 200 mg via INTRAVENOUS

## 2018-07-15 MED ORDER — DEXAMETHASONE SODIUM PHOSPHATE 10 MG/ML IJ SOLN
INTRAMUSCULAR | Status: AC
  Filled 2018-07-15: qty 1

## 2018-07-15 MED ORDER — LIDOCAINE HCL (CARDIAC) PF 100 MG/5ML IV SOSY
PREFILLED_SYRINGE | INTRAVENOUS | Status: DC | PRN
  Administered 2018-07-15: 100 mg via INTRAVENOUS

## 2018-07-15 MED ORDER — OXYCODONE HCL 5 MG PO TABS: 5.0000 mg | ORAL_TABLET | Freq: Once | ORAL | Status: DC | PRN

## 2018-07-15 MED ORDER — MIDAZOLAM HCL 2 MG/2ML IJ SOLN
INTRAMUSCULAR | Status: DC | PRN
  Administered 2018-07-15: 2 mg via INTRAVENOUS

## 2018-07-15 MED ORDER — FENTANYL CITRATE (PF) 100 MCG/2ML IJ SOLN
INTRAMUSCULAR | Status: DC | PRN
  Administered 2018-07-15: 25 ug via INTRAVENOUS
  Administered 2018-07-15: 50 ug via INTRAVENOUS
  Administered 2018-07-15 (×3): 25 ug via INTRAVENOUS

## 2018-07-15 MED ORDER — DEXAMETHASONE SODIUM PHOSPHATE 10 MG/ML IJ SOLN
INTRAMUSCULAR | Status: DC | PRN
  Administered 2018-07-15: 5 mg via INTRAVENOUS

## 2018-07-15 MED ORDER — PHENYLEPHRINE HCL 10 MG/ML IJ SOLN
INTRAMUSCULAR | Status: DC | PRN
  Administered 2018-07-15 (×5): 100 ug via INTRAVENOUS

## 2018-07-15 MED ORDER — LIDOCAINE HCL (PF) 2 % IJ SOLN
INTRAMUSCULAR | Status: AC
Start: 2018-07-15 — End: ?
  Filled 2018-07-15: qty 10

## 2018-07-15 MED ORDER — FENTANYL CITRATE (PF) 100 MCG/2ML IJ SOLN
INTRAMUSCULAR | Status: AC
  Filled 2018-07-15: qty 2

## 2018-07-15 MED ORDER — ONDANSETRON HCL 4 MG/2ML IJ SOLN
INTRAMUSCULAR | Status: DC | PRN
  Administered 2018-07-15: 4 mg via INTRAVENOUS

## 2018-07-15 MED ORDER — DEXMEDETOMIDINE HCL 200 MCG/2ML IV SOLN
INTRAVENOUS | Status: DC | PRN
  Administered 2018-07-15: 12 ug via INTRAVENOUS

## 2018-07-15 MED ORDER — SODIUM CHLORIDE 0.9 % IV SOLN
INTRAVENOUS | Status: DC | PRN
  Administered 2018-07-15: 25 ug/min via INTRAVENOUS

## 2018-07-15 MED ORDER — MIDAZOLAM HCL 2 MG/2ML IJ SOLN
INTRAMUSCULAR | Status: AC
  Filled 2018-07-15: qty 2

## 2018-07-15 MED ORDER — PHENYLEPHRINE HCL 10 MG/ML IJ SOLN
INTRAMUSCULAR | Status: AC
  Filled 2018-07-15: qty 1

## 2018-07-15 MED ORDER — NORCO 5-325 MG PO TABS: 1.0000 | ORAL_TABLET | Freq: Four times a day (QID) | ORAL | 0 refills | Status: AC | PRN

## 2018-07-15 MED ORDER — DEXMEDETOMIDINE HCL IN NACL 200 MCG/50ML IV SOLN
INTRAVENOUS | Status: AC
  Filled 2018-07-15: qty 50

## 2018-07-15 MED ORDER — BUPIVACAINE HCL (PF) 0.5 % IJ SOLN
INTRAMUSCULAR | Status: DC | PRN
  Administered 2018-07-15: 20 mL

## 2018-07-15 SURGICAL SUPPLY — 44 items
ABLATOR ASPIRATE 50D MULTI-PRT (SURGICAL WAND) ×2 IMPLANT
ARTHROWAND PARAGON T2 (SURGICAL WAND)
BLADE ABRADER 4.5 (BLADE) ×1 IMPLANT
BLADE SHAVER 4.5X7 STR FR (MISCELLANEOUS) ×3 IMPLANT
BLADE SHAVER TORPEDO 4X13 (MISCELLANEOUS) ×2 IMPLANT
BNDG ESMARK 6X12 TAN STRL LF (GAUZE/BANDAGES/DRESSINGS) ×3 IMPLANT
BUR ABRADER 4.0 W/FLUTE AQUA (MISCELLANEOUS) ×1 IMPLANT
BURR ABRADER 4.0 W/FLUTE AQUA (MISCELLANEOUS)
CHLORAPREP W/TINT 26ML (MISCELLANEOUS) ×3 IMPLANT
CUFF TOURN 24 STER (MISCELLANEOUS) IMPLANT
DRAPE LEGGINS SURG 28X43 STRL (DRAPES) ×3 IMPLANT
DW OUTFLOW CASSETTE/TUBE SET (MISCELLANEOUS) ×2 IMPLANT
GAUZE SPONGE 4X4 12PLY STRL (GAUZE/BANDAGES/DRESSINGS) ×3 IMPLANT
GLOVE BIO SURGEON STRL SZ8 (GLOVE) ×3 IMPLANT
GLOVE BIOGEL M STRL SZ7.5 (GLOVE) ×3 IMPLANT
GLOVE INDICATOR 8.0 STRL GRN (GLOVE) ×3 IMPLANT
GOWN STRL REUS W/ TWL LRG LVL3 (GOWN DISPOSABLE) ×1 IMPLANT
GOWN STRL REUS W/TWL LRG LVL3 (GOWN DISPOSABLE) ×3
GOWN STRL REUS W/TWL LRG LVL4 (GOWN DISPOSABLE) ×3 IMPLANT
IV LACTATED RINGER IRRG 3000ML (IV SOLUTION) ×6
IV LR IRRIG 3000ML ARTHROMATIC (IV SOLUTION) ×2 IMPLANT
KIT TURNOVER KIT A (KITS) ×3 IMPLANT
MANIFOLD NEPTUNE II (INSTRUMENTS) ×3 IMPLANT
PACK ARTHROSCOPY KNEE (MISCELLANEOUS) ×3 IMPLANT
PADDING CAST 4IN STRL (MISCELLANEOUS)
PADDING CAST BLEND 4X4 STRL (MISCELLANEOUS) IMPLANT
SET TUBE SUCT SHAVER OUTFL 24K (TUBING) ×1 IMPLANT
SET TUBE TIP INTRA-ARTICULAR (MISCELLANEOUS) ×1 IMPLANT
SOL PREP PVP 2OZ (MISCELLANEOUS) ×3
SOLUTION PREP PVP 2OZ (MISCELLANEOUS) ×1 IMPLANT
SUT ETH BLK MONO 3 0 FS 1 12/B (SUTURE) ×3 IMPLANT
SUT ETHILON 3-0 FS-10 30 BLK (SUTURE) ×3
SUTURE EHLN 3-0 FS-10 30 BLK (SUTURE) ×1 IMPLANT
TAPE MICROFOAM 4IN (TAPE) ×3 IMPLANT
TUBING ARTHRO INFLOW-ONLY STRL (TUBING) ×1 IMPLANT
TUBING REDEUCE PAT W/CON 8IN (MISCELLANEOUS) ×2 IMPLANT
TUBING REDEUCE PUMP W/CON 8IN (MISCELLANEOUS) ×2 IMPLANT
WAND 30 DEG SABER W/CORD (SURGICAL WAND) ×3 IMPLANT
WAND ARTHRO PARAGON T2 (SURGICAL WAND) IMPLANT
WAND COVAC 50 IFS (MISCELLANEOUS) IMPLANT
WAND HAND CNTRL MULTIVAC 50 (MISCELLANEOUS) IMPLANT
WAND HAND CNTRL MULTIVAC 90 (MISCELLANEOUS) IMPLANT
WRAP KNEE W/COLD PACKS 25.5X14 (SOFTGOODS) ×3 IMPLANT
torpedo ×2 IMPLANT

## 2018-07-15 NOTE — Anesthesia Preprocedure Evaluation (Signed)
Anesthesia Evaluation  Patient identified by MRN, date of birth, ID band Patient awake    Reviewed: Allergy & Precautions, H&P , NPO status , Patient's Chart, lab work & pertinent test results  History of Anesthesia Complications Negative for: history of anesthetic complications  Airway Mallampati: III  TM Distance: <3 FB Neck ROM: full    Dental  (+) Chipped, Poor Dentition   Pulmonary neg shortness of breath, asthma ,           Cardiovascular Exercise Tolerance: Good hypertension, (-) angina(-) Past MI and (-) DOE      Neuro/Psych  Headaches, PSYCHIATRIC DISORDERS Anxiety Depression    GI/Hepatic Neg liver ROS, GERD  Medicated and Controlled,  Endo/Other  negative endocrine ROS  Renal/GU      Musculoskeletal  (+) Arthritis ,   Abdominal   Peds  Hematology negative hematology ROS (+)   Anesthesia Other Findings Past Medical History: No date: Anxiety No date: Arthritis     Comment:  BIL KNEES No date: Asthma     Comment:  WELL CONTROLLED No date: Depression No date: GERD (gastroesophageal reflux disease)     Comment:  OCC No date: Headache     Comment:  H/O MIGRAINES No date: Hypertension  Past Surgical History: No date: FINGER SURGERY No date: THUMB SURGERY No date: TOE SURGERY  BMI    Body Mass Index:  31.75 kg/m      Reproductive/Obstetrics negative OB ROS                             Anesthesia Physical Anesthesia Plan  ASA: III  Anesthesia Plan: General LMA   Post-op Pain Management:    Induction: Intravenous  PONV Risk Score and Plan: Ondansetron, Dexamethasone and Midazolam  Airway Management Planned: LMA  Additional Equipment:   Intra-op Plan:   Post-operative Plan: Extubation in OR  Informed Consent: I have reviewed the patients History and Physical, chart, labs and discussed the procedure including the risks, benefits and alternatives for the  proposed anesthesia with the patient or authorized representative who has indicated his/her understanding and acceptance.   Dental Advisory Given  Plan Discussed with: Anesthesiologist, CRNA and Surgeon  Anesthesia Plan Comments: (Patient consented for risks of anesthesia including but not limited to:  - adverse reactions to medications - damage to teeth, lips or other oral mucosa - sore throat or hoarseness - Damage to heart, brain, lungs or loss of life  Patient voiced understanding.)        Anesthesia Quick Evaluation

## 2018-07-15 NOTE — Transfer of Care (Signed)
Immediate Anesthesia Transfer of Care Note  Patient: Cody Huang  Procedure(s) Performed: ARTHROSCOPY left  KNEE, partial lateral menisectomy and chondral debridement (Left Knee)  Patient Location: PACU  Anesthesia Type:General  Level of Consciousness: drowsy  Airway & Oxygen Therapy: Patient Spontanous Breathing and Patient connected to face mask oxygen  Post-op Assessment: Report given to RN and Post -op Vital signs reviewed and stable  Post vital signs: Reviewed and stable  Last Vitals:  Vitals Value Taken Time  BP    Temp    Pulse 75 07/15/2018  9:07 AM  Resp    SpO2 95 % 07/15/2018  9:07 AM  Vitals shown include unvalidated device data.  Last Pain:  Vitals:   07/15/18 0624  TempSrc: Tympanic  PainSc: 0-No pain         Complications: No apparent anesthesia complications

## 2018-07-15 NOTE — Op Note (Signed)
Patient: Cody Huang, Bishop A.  Preoperative diagnosis: Torn lateral meniscus left knee plus multiple chondral lesions  Postop diagnosis: Same  Operation: Arthroscopic partial lateral meniscectomy plus chondral debridement  Surgeon: Randon GoldsmithHarold B Mikita Lesmeister, MD  Anesthesia: Gen.   History: Patient's had a long history of left knee pain.  The plain films revealed some medial compartment narrowing.  The patient had an MRI which revealed medial compartment chondral pathology plus torn lateral meniscus.The patient was scheduled for surgery due to persistent discomfort despite conservative treatment.  The patient was taken the operating room where satisfactory general anesthesia was achieved. A tourniquet and leg holder were was applied to the left thigh. A well leg support was applied to the nonoperative extremity. The left knee was prepped and draped in usual fashion for an arthroscopic procedure. An inflow cannula was introduced superomedially. The joint was distended with lactated Ringer's. Scope was introduced through an inferolateral puncture wound and a probe through an inferomedial puncture wound. Inspection of the medial compartment revealed significant grade 2 and 3 chondral changes in the medial compartment.  These involved both the femoral condylar surface and the tibial plateau.  There was some fraying of the inner border of the posterior horn of the medial meniscus.  I went ahead and debrided the posterior horn of the medial meniscus with a motorized resector and then debrided the frayed articular surfaces with an angled radiofrequency wand at a low setting.  Inspection of the intercondylar notch revealed intact but somewhat frayed anterior cruciate. Inspection of the the lateral compartment revealed a degenerative tear of the posterior horn of the lateral meniscus.  Again there were grade 2 and 3 chondral changes in the lateral compartment involving primarily the lateral femoral condyle.  I resected the  torn posterior horn of the lateral meniscus with basket biters and motorized resector.  The frayed lateral femoral condyle articular surface was debrided with an angled radiofrequency wand.    Trochlear groove was inspected and appeared to be fairly smooth.  Inspection of the retropatellar surface revealed grade 2 chondral pathology.. I observed patella tracking from the inferolateral portal. The patella seemed to track fairly well.  The instruments were removed from the joint at this time. The puncture wounds were closed with 3-0 nylon in vertical mattress fashion. I injected each puncture wound with several cc of half percent Marcaine without epinephrine. Betadine was applied to the wounds followed by sterile dressing. An ice pack was applied to the left knee. The patient was awakened and transferred to the stretcher bed. The patient was taken to the recovery room in satisfactory condition.  The tourniquet was not inflated during the course of the procedure. Blood loss was negligible.

## 2018-07-15 NOTE — Progress Notes (Signed)
Circulation positive to left leg   Pedal pulse positive

## 2018-07-15 NOTE — Discharge Instructions (Addendum)
° °[facility]  °Harold B. Cody, Jr., M.D. [phone]  ° °KNEE ARTHROSCOPY POST OPERATION INSTRUCTIONS: ° °PLEASE READ THESE INSTRUCTIONS ABOUT POST OPERATION CARE. THEY WILL ANSWER MOST OF YOUR QUESTIONS.  °You have been given a prescription for pain. Please take as directed for pain.  °You can walk, keeping the knee slightly stiff-avoid doing too much bending the first day. (if ACL reconstruction is performed, keep brace locked in extension when walking.)  °You will use crutches or cane if needed. Can weight bear as tolerated  °Plan to take three to four days off from work. You can resume work when you are comfortable. (This can be a week or more, depending on the type of work you do.)  °To reduce pain and swelling, place one to two pillows under the knee the first two or three days when sitting or lying. An ice pack may be placed on top of the area over the dressing. Instructions for making homemade icepack are as follow:  °Flexible homemade alcohol water ice pack  °2 cups water  °1 cup rubbing alcohol  °food coloring for the blue tint (optional)  °2 zip-top bags - gallon-size  °Mix the water and alcohol together in one of your zip-top bags and add food coloring. Release as much air as possible and seal the bag. Place in freezer for at least 12 hours.  °The small incisions in your knee are closed with nylon stitches. They will be removed in the office.  °The bulky dressing may be removed in the third day after surgery. (If ACL surgery-DO NOT REMOVE BANDAGES). Put a waterproof band-aid over each stitch. Do not put any creams or ointments on wounds. You may shower at this time, but change waterproof band-aids after showering. KEEP INCISIONS CLEAN AND DRY UNTIL YOU RETURN TO THE OFFICE.  °Sometimes the operative area remains somewhat painful and swollen for several weeks. This is usually nothing to worry about, but call if you have any excessive symptoms, especially  fever. It is not unusual to have a low grade fever of 99 degrees for the first few days. If persist after 3-4 days call the office. It is not uncommon for the pain to be a little worse on the third day after surgery.  °Begin doing gentle exercises right away. They will be limited by the amount of pain and swelling you have.  Exercising will reduce the swelling, increase motion, and prevent muscle weakness. Exercises: Straight leg raising and gentle knee bending.  °Take a 325 milligram aspirin or Bufferin tablet twice a day for 2 weeks after meals or milk. This along with elevation will help reduce the possibility of phlebitis in your operated leg.  °Avoid strenuous athletics for a minimum of 4 to 6 weeks after arthroscopic surgery (approximately five months if ACL surgery).  °If the surgery included ACL reconstruction the brace that is supplied to the extremity post surgery is to be locked in extension when you are asleep and is to be locked in extension when you are ambulating. It can be unlocked for exercises or sitting.  °Keep your post surgery appointment that has been made for you. If you do not remember the date call [phone]. Your follow up appointment should be between 7-10 days.  ° °AMBULATORY SURGERY  °DISCHARGE INSTRUCTIONS ° ° °1) The drugs that you were given will stay in your system until tomorrow so for the next 24 hours you should not: ° °A) Drive an automobile °B)   Make any legal decisions °C) Drink any alcoholic beverage ° ° °2) You may resume regular meals tomorrow.  Today it is better to start with liquids and gradually work up to solid foods. ° °You may eat anything you prefer, but it is better to start with liquids, then soup and crackers, and gradually work up to solid foods. ° ° °3) Please notify your doctor immediately if you have any unusual bleeding, trouble breathing, redness and pain at the surgery site, drainage, fever, or pain not relieved by medication. ° ° ° °4) Additional  Instructions: ° ° ° ° ° ° ° °Please contact your physician with any problems or Same Day Surgery at [phone], Monday through Friday 6 am to 4 pm, or [place] [facility] number at [phone]. ° °

## 2018-07-15 NOTE — Anesthesia Procedure Notes (Signed)
Procedure Name: LMA Insertion Date/Time: 07/15/2018 7:37 AM Performed by: Conni SlipperHarris, Oona Trammel N, RN Pre-anesthesia Checklist: Patient identified, Emergency Drugs available, Suction available, Patient being monitored and Timeout performed Patient Re-evaluated:Patient Re-evaluated prior to induction Oxygen Delivery Method: Circle system utilized Preoxygenation: Pre-oxygenation with 100% oxygen Induction Type: IV induction Ventilation: Mask ventilation without difficulty LMA: LMA inserted LMA Size: 4.5 Tube secured with: Tape

## 2018-07-15 NOTE — Anesthesia Postprocedure Evaluation (Signed)
Anesthesia Post Note  Patient: Cody Huang  Procedure(s) Performed: ARTHROSCOPY left  KNEE, partial lateral menisectomy and chondral debridement (Left Knee)  Patient location during evaluation: PACU Anesthesia Type: General Level of consciousness: awake and alert Pain management: pain level controlled Vital Signs Assessment: post-procedure vital signs reviewed and stable Respiratory status: spontaneous breathing, nonlabored ventilation, respiratory function stable and patient connected to nasal cannula oxygen Cardiovascular status: blood pressure returned to baseline and stable Postop Assessment: no apparent nausea or vomiting Anesthetic complications: no     Last Vitals:  Vitals:   07/15/18 1039 07/15/18 1041  BP:  138/82  Pulse: (P) 70   Resp:    Temp:    SpO2:  99%    Last Pain:  Vitals:   07/15/18 1041  TempSrc:   PainSc: 4                  Joseph K Piscitello

## 2018-07-15 NOTE — H&P (Signed)
  H and P reviewed. No changes. Uploaded at later date. 

## 2018-07-15 NOTE — Anesthesia Post-op Follow-up Note (Signed)
Anesthesia QCDR form completed.        

## 2022-05-10 NOTE — Discharge Instructions (Signed)
Instructions after Total Knee Replacement   Kurt Azimi P. Christepher Melchior, Jr., M.D.     Dept. of Orthopaedics & Sports Medicine  [facility]  [address]  [place], [place]  [address]  Phone: [phone]   Fax: [phone]    DIET: Drink plenty of non-alcoholic fluids. Resume your normal diet. Include foods high in fiber.  ACTIVITY:  You may use crutches or a walker with weight-bearing as tolerated, unless instructed otherwise. You may be weaned off of the walker or crutches by your Physical Therapist.  Do NOT place pillows under the knee. Anything placed under the knee could limit your ability to straighten the knee.   Continue doing gentle exercises. Exercising will reduce the pain and swelling, increase motion, and prevent muscle weakness.   Please continue to use the TED compression stockings for 6 weeks. You may remove the stockings at night, but should reapply them in the morning. Do not drive or operate any equipment until instructed.  WOUND CARE:  Continue to use the PolarCare or ice packs periodically to reduce pain and swelling. You may bathe or shower after the staples are removed at the first office visit following surgery.  MEDICATIONS: You may resume your regular medications. Please take the pain medication as prescribed on the medication. Do not take pain medication on an empty stomach. You have been given a prescription for a blood thinner (Lovenox or Coumadin). Please take the medication as instructed. (NOTE: After completing a 2 week course of Lovenox, take one Enteric-coated aspirin once a day. This along with elevation will help reduce the possibility of phlebitis in your operated leg.) Do not drive or drink alcoholic beverages when taking pain medications.  CALL THE OFFICE FOR: Temperature above 101 degrees Excessive bleeding or drainage on the dressing. Excessive swelling, coldness, or paleness of the toes. Persistent nausea and vomiting.  FOLLOW-UP:  You  should have an appointment to return to the office in 10-14 days after surgery. Arrangements have been made for continuation of Physical Therapy (either home therapy or outpatient therapy).   [facility] Department Directory         [email or web address]       [email or web address]          Cardiology  Appointments: [place] - [phone] [place] - [phone]  Endocrinology  Appointments: [place] - [phone] [place] - [phone]  Gastroenterology  Appointments: [place] - [phone] [place] - [phone]        General Surgery   Appointments: [place] - [phone]  Internal Medicine/Family Medicine  Appointments: [place] - [phone] [place] - [phone] [place] - [phone]  Metabolic and Weigh Loss Surgery  Appointments: [place] - [phone]        Neurology  Appointments: [place] - [phone] [place] - [phone]  Neurosurgery  Appointments: [place] - [phone]  Obstetrics & Gynecology  Appointments: [place] - [phone] [place] - [phone]        Pediatrics  Appointments: [place] - [phone] [place] - [phone]  Physiatry  Appointments: [place] -[phone]  Physical Therapy  Appointments: [place] - [phone] [place] - [phone]        Podiatry  Appointments: [place] - [phone] [place] - [phone]  Pulmonology  Appointments: [place] - [phone]  Rheumatology  Appointments: [place] - [phone]         Location: [facility]  [address] , [place]  [address]  [place] Location: [facility] [address] [place], [place]  [address]  [place] Location: [facility] [address] [place], [place]  [address]    

## 2022-06-18 ENCOUNTER — Encounter
Admission: RE | Admit: 2022-06-18 | Discharge: 2022-06-18 | Disposition: A | Discharge status: Home / Self Care | Payer: Commercial Managed Care - PPO | Source: Ambulatory Visit | Attending: Orthopedic Surgery | Admitting: Orthopedic Surgery

## 2022-06-18 VITALS — BP 154/94 | HR 77 | Temp 98.0°F | Resp 12 | Ht 75.0 in | Wt 254.0 lb

## 2022-06-18 DIAGNOSIS — Z01812 Encounter for preprocedural laboratory examination: Secondary | ICD-10-CM

## 2022-06-18 DIAGNOSIS — M1711 Unilateral primary osteoarthritis, right knee: Secondary | ICD-10-CM | POA: Diagnosis not present

## 2022-06-18 DIAGNOSIS — Z01818 Encounter for other preprocedural examination: Secondary | ICD-10-CM | POA: Insufficient documentation

## 2022-06-18 HISTORY — DX: Migraine, unspecified, not intractable, without status migrainosus: G43.909

## 2022-06-18 LAB — URINALYSIS, ROUTINE W REFLEX MICROSCOPIC
Bilirubin Urine: NEGATIVE
Glucose, UA: NEGATIVE mg/dL
Hgb urine dipstick: NEGATIVE
Ketones, ur: NEGATIVE mg/dL
Leukocytes,Ua: NEGATIVE
Nitrite: NEGATIVE
Protein, ur: NEGATIVE mg/dL
Specific Gravity, Urine: 1.012 (ref 1.005–1.030)
pH: 6 (ref 5.0–8.0)

## 2022-06-18 LAB — COMPREHENSIVE METABOLIC PANEL
ALT: 41 U/L (ref 0–44)
AST: 30 U/L (ref 15–41)
Albumin: 4.3 g/dL (ref 3.5–5.0)
Alkaline Phosphatase: 48 U/L (ref 38–126)
Anion gap: 10 (ref 5–15)
BUN: 12 mg/dL (ref 6–20)
CO2: 25 mmol/L (ref 22–32)
Calcium: 9.1 mg/dL (ref 8.9–10.3)
Chloride: 104 mmol/L (ref 98–111)
Creatinine, Ser: 1 mg/dL (ref 0.61–1.24)
GFR, Estimated: >60 mL/min (ref >60)
Glucose, Bld: 110 mg/dL — ABNORMAL HIGH (ref 70–99)
Potassium: 4.5 mmol/L (ref 3.5–5.1)
Sodium: 139 mmol/L (ref 135–145)
Total Bilirubin: 0.8 mg/dL (ref 0.3–1.2)
Total Protein: 7.5 g/dL (ref 6.5–8.1)

## 2022-06-18 LAB — CBC
HCT: 44.2 % (ref 39.0–52.0)
Hemoglobin: 15.6 g/dL (ref 13.0–17.0)
MCH: 32.2 pg (ref 26.0–34.0)
MCHC: 35.3 g/dL (ref 30.0–36.0)
MCV: 91.3 fL (ref 80.0–100.0)
Platelets: 135 10*3/uL — ABNORMAL LOW (ref 150–400)
RBC: 4.84 MIL/uL (ref 4.22–5.81)
RDW: 13 % (ref 11.5–15.5)
WBC: 5.3 10*3/uL (ref 4.0–10.5)
nRBC: 0 % (ref 0.0–0.2)

## 2022-06-18 LAB — TYPE AND SCREEN
ABO/RH(D): O POS
Antibody Screen: NEGATIVE

## 2022-06-18 LAB — SURGICAL PCR SCREEN
MRSA, PCR: NEGATIVE
Staphylococcus aureus: NEGATIVE

## 2022-06-18 LAB — SEDIMENTATION RATE: Sed Rate: 3 mm/hr (ref 0–20)

## 2022-06-18 LAB — C-REACTIVE PROTEIN: CRP: <0.5 mg/dL (ref <1.0)

## 2022-06-20 LAB — IGE: IgE (Immunoglobulin E), Serum: 8 IU/mL (ref 6–495)

## 2022-06-23 ENCOUNTER — Encounter: Payer: Self-pay | Admitting: Orthopedic Surgery

## 2022-06-27 MED ORDER — ORAL CARE MOUTH RINSE: 15.0000 mL | Freq: Once | OROMUCOSAL | Status: AC

## 2022-06-27 MED ORDER — GABAPENTIN 300 MG PO CAPS: 300.0000 mg | ORAL_CAPSULE | Freq: Once | ORAL | Status: AC

## 2022-06-27 MED ORDER — CELECOXIB 200 MG PO CAPS: 400.0000 mg | ORAL_CAPSULE | Freq: Once | ORAL | Status: AC

## 2022-06-27 MED ORDER — CEFAZOLIN SODIUM-DEXTROSE 2-4 GM/100ML-% IV SOLN
2.0000 g | INTRAVENOUS | Status: AC
  Administered 2022-06-28: 2 g via INTRAVENOUS

## 2022-06-27 MED ORDER — CHLORHEXIDINE GLUCONATE 0.12 % MT SOLN: 15.0000 mL | Freq: Once | OROMUCOSAL | Status: AC

## 2022-06-27 MED ORDER — LACTATED RINGERS IV SOLN: INTRAVENOUS | Status: DC

## 2022-06-27 MED ORDER — CHLORHEXIDINE GLUCONATE 4 % EX LIQD
60.0000 mL | Freq: Once | CUTANEOUS | Status: AC
  Administered 2022-06-28: 4 via TOPICAL

## 2022-06-27 MED ORDER — TRANEXAMIC ACID-NACL 1000-0.7 MG/100ML-% IV SOLN
1000.0000 mg | INTRAVENOUS | Status: AC
  Administered 2022-06-28: 1000 mg via INTRAVENOUS

## 2022-06-27 MED ORDER — FAMOTIDINE 20 MG PO TABS: 20.0000 mg | ORAL_TABLET | Freq: Once | ORAL | Status: AC

## 2022-06-27 MED ORDER — DEXAMETHASONE SODIUM PHOSPHATE 10 MG/ML IJ SOLN: 8.0000 mg | Freq: Once | INTRAMUSCULAR | Status: AC

## 2022-06-28 ENCOUNTER — Observation Stay: Payer: Commercial Managed Care - PPO

## 2022-06-28 ENCOUNTER — Encounter: Payer: Self-pay | Admitting: Orthopedic Surgery

## 2022-06-28 ENCOUNTER — Other Ambulatory Visit: Payer: Self-pay

## 2022-06-28 ENCOUNTER — Observation Stay
Admission: RE | Admit: 2022-06-28 | Discharge: 2022-06-29 | Disposition: A | Discharge status: Home Health | Payer: Commercial Managed Care - PPO | Attending: Orthopedic Surgery | Admitting: Orthopedic Surgery

## 2022-06-28 ENCOUNTER — Ambulatory Visit: Payer: Commercial Managed Care - PPO | Admitting: Urgent Care

## 2022-06-28 ENCOUNTER — Ambulatory Visit: Payer: Commercial Managed Care - PPO | Admitting: Registered Nurse

## 2022-06-28 ENCOUNTER — Encounter
Admission: RE | Disposition: A | Discharge status: Home Health | Payer: Self-pay | Source: Home / Self Care | Attending: Orthopedic Surgery

## 2022-06-28 DIAGNOSIS — Z79899 Other long term (current) drug therapy: Secondary | ICD-10-CM | POA: Diagnosis not present

## 2022-06-28 DIAGNOSIS — F17220 Nicotine dependence, chewing tobacco, uncomplicated: Secondary | ICD-10-CM | POA: Insufficient documentation

## 2022-06-28 DIAGNOSIS — Z96659 Presence of unspecified artificial knee joint: Secondary | ICD-10-CM

## 2022-06-28 DIAGNOSIS — J45909 Unspecified asthma, uncomplicated: Secondary | ICD-10-CM | POA: Insufficient documentation

## 2022-06-28 DIAGNOSIS — I10 Essential (primary) hypertension: Secondary | ICD-10-CM | POA: Diagnosis not present

## 2022-06-28 DIAGNOSIS — J4520 Mild intermittent asthma, uncomplicated: Secondary | ICD-10-CM | POA: Insufficient documentation

## 2022-06-28 DIAGNOSIS — M1711 Unilateral primary osteoarthritis, right knee: Principal | ICD-10-CM | POA: Insufficient documentation

## 2022-06-28 DIAGNOSIS — Z7951 Long term (current) use of inhaled steroids: Secondary | ICD-10-CM | POA: Insufficient documentation

## 2022-06-28 HISTORY — PX: KNEE ARTHROPLASTY: SHX992

## 2022-06-28 LAB — ABO/RH: ABO/RH(D): O POS

## 2022-06-28 SURGERY — ARTHROPLASTY, KNEE, TOTAL, USING IMAGELESS COMPUTER-ASSISTED NAVIGATION
Anesthesia: General | Site: Knee | Laterality: Right

## 2022-06-28 MED ORDER — PHENYLEPHRINE HCL (PRESSORS) 10 MG/ML IV SOLN
INTRAVENOUS | Status: AC
  Filled 2022-06-28: qty 1

## 2022-06-28 MED ORDER — CELECOXIB 200 MG PO CAPS
200.0000 mg | ORAL_CAPSULE | Freq: Two times a day (BID) | ORAL | Status: DC
  Administered 2022-06-28 – 2022-06-29 (×3): 200 mg via ORAL
  Filled 2022-06-28 (×3): qty 1

## 2022-06-28 MED ORDER — METOPROLOL TARTRATE 50 MG PO TABS
100.0000 mg | ORAL_TABLET | Freq: Every day | ORAL | Status: DC
  Administered 2022-06-29: 100 mg via ORAL
  Filled 2022-06-28: qty 2

## 2022-06-28 MED ORDER — MOMETASONE FURO-FORMOTEROL FUM 100-5 MCG/ACT IN AERO
2.0000 | INHALATION_SPRAY | Freq: Two times a day (BID) | RESPIRATORY_TRACT | Status: DC
  Administered 2022-06-28: 2 via RESPIRATORY_TRACT
  Filled 2022-06-28: qty 8.8

## 2022-06-28 MED ORDER — ALBUTEROL SULFATE (2.5 MG/3ML) 0.083% IN NEBU: 2.5000 mg | INHALATION_SOLUTION | Freq: Four times a day (QID) | RESPIRATORY_TRACT | Status: DC | PRN

## 2022-06-28 MED ORDER — EPHEDRINE SULFATE (PRESSORS) 50 MG/ML IJ SOLN
INTRAMUSCULAR | Status: DC | PRN
  Administered 2022-06-28: 10 mg via INTRAVENOUS
  Administered 2022-06-28: 5 mg via INTRAVENOUS

## 2022-06-28 MED ORDER — FENTANYL CITRATE (PF) 100 MCG/2ML IJ SOLN
INTRAMUSCULAR | Status: AC
  Administered 2022-06-28: 50 ug via INTRAVENOUS
  Filled 2022-06-28: qty 2

## 2022-06-28 MED ORDER — FENTANYL CITRATE (PF) 100 MCG/2ML IJ SOLN
INTRAMUSCULAR | Status: DC | PRN
Start: 2022-06-28 — End: 2022-06-28
  Administered 2022-06-28 (×2): 50 ug via INTRAVENOUS
  Administered 2022-06-28: 100 ug via INTRAVENOUS
  Administered 2022-06-28 (×2): 50 ug via INTRAVENOUS

## 2022-06-28 MED ORDER — SODIUM CHLORIDE FLUSH 0.9 % IV SOLN
INTRAVENOUS | Status: AC
  Filled 2022-06-28: qty 80

## 2022-06-28 MED ORDER — OXYCODONE HCL 5 MG PO TABS: 5.0000 mg | ORAL_TABLET | ORAL | Status: DC | PRN

## 2022-06-28 MED ORDER — CHLORHEXIDINE GLUCONATE 0.12 % MT SOLN
OROMUCOSAL | Status: AC
  Administered 2022-06-28: 15 mL via OROMUCOSAL
  Filled 2022-06-28: qty 15

## 2022-06-28 MED ORDER — METOCLOPRAMIDE HCL 5 MG PO TABS
10.0000 mg | ORAL_TABLET | Freq: Three times a day (TID) | ORAL | Status: DC
  Administered 2022-06-28 – 2022-06-29 (×4): 10 mg via ORAL
  Filled 2022-06-28 (×4): qty 2

## 2022-06-28 MED ORDER — OXYMETAZOLINE HCL 0.05 % NA SOLN
1.0000 | Freq: Two times a day (BID) | NASAL | Status: DC | PRN
Start: 2022-06-28 — End: 2022-06-29

## 2022-06-28 MED ORDER — ONDANSETRON HCL 4 MG/2ML IJ SOLN
INTRAMUSCULAR | Status: DC | PRN
  Administered 2022-06-28: 8 mg via INTRAVENOUS

## 2022-06-28 MED ORDER — SUGAMMADEX SODIUM 200 MG/2ML IV SOLN
INTRAVENOUS | Status: DC | PRN
  Administered 2022-06-28: 400 mg via INTRAVENOUS

## 2022-06-28 MED ORDER — BUPIVACAINE LIPOSOME 1.3 % IJ SUSP
INTRAMUSCULAR | Status: AC
  Filled 2022-06-28: qty 40

## 2022-06-28 MED ORDER — ACETAMINOPHEN 10 MG/ML IV SOLN
INTRAVENOUS | Status: AC
  Filled 2022-06-28: qty 100

## 2022-06-28 MED ORDER — 0.9 % SODIUM CHLORIDE (POUR BTL) OPTIME
TOPICAL | Status: DC | PRN
  Administered 2022-06-28: 500 mL

## 2022-06-28 MED ORDER — HYDROMORPHONE HCL 1 MG/ML IJ SOLN
INTRAMUSCULAR | Status: DC | PRN
  Administered 2022-06-28: 1 mg via INTRAVENOUS

## 2022-06-28 MED ORDER — ENSURE PRE-SURGERY PO LIQD
296.0000 mL | Freq: Once | ORAL | Status: AC
  Administered 2022-06-28: 296 mL via ORAL

## 2022-06-28 MED ORDER — FENTANYL CITRATE (PF) 100 MCG/2ML IJ SOLN
INTRAMUSCULAR | Status: AC
  Filled 2022-06-28: qty 2

## 2022-06-28 MED ORDER — OXYCODONE HCL 5 MG PO TABS
10.0000 mg | ORAL_TABLET | ORAL | Status: DC | PRN
  Administered 2022-06-28 – 2022-06-29 (×4): 10 mg via ORAL
  Filled 2022-06-28 (×4): qty 2

## 2022-06-28 MED ORDER — TRANEXAMIC ACID-NACL 1000-0.7 MG/100ML-% IV SOLN: 1000.0000 mg | Freq: Once | INTRAVENOUS | Status: AC

## 2022-06-28 MED ORDER — ROCURONIUM BROMIDE 100 MG/10ML IV SOLN
INTRAVENOUS | Status: DC | PRN
  Administered 2022-06-28: 50 mg via INTRAVENOUS
  Administered 2022-06-28: 30 mg via INTRAVENOUS
  Administered 2022-06-28: 50 mg via INTRAVENOUS
  Administered 2022-06-28: 40 mg via INTRAVENOUS
  Administered 2022-06-28: 30 mg via INTRAVENOUS

## 2022-06-28 MED ORDER — MIDAZOLAM HCL 2 MG/2ML IJ SOLN
INTRAMUSCULAR | Status: AC
  Filled 2022-06-28: qty 2

## 2022-06-28 MED ORDER — DIPHENHYDRAMINE HCL 12.5 MG/5ML PO ELIX: 12.5000 mg | ORAL_SOLUTION | ORAL | Status: DC | PRN

## 2022-06-28 MED ORDER — LORATADINE 10 MG PO TABS
10.0000 mg | ORAL_TABLET | Freq: Every day | ORAL | Status: DC
  Administered 2022-06-28 – 2022-06-29 (×2): 10 mg via ORAL
  Filled 2022-06-28 (×2): qty 1

## 2022-06-28 MED ORDER — HYDRALAZINE HCL 20 MG/ML IJ SOLN
INTRAMUSCULAR | Status: DC | PRN
  Administered 2022-06-28 (×2): 5 mg via INTRAVENOUS

## 2022-06-28 MED ORDER — ACETAMINOPHEN 10 MG/ML IV SOLN
INTRAVENOUS | Status: DC | PRN
  Administered 2022-06-28: 1000 mg via INTRAVENOUS

## 2022-06-28 MED ORDER — ALUM & MAG HYDROXIDE-SIMETH 200-200-20 MG/5ML PO SUSP: 30.0000 mL | ORAL | Status: DC | PRN

## 2022-06-28 MED ORDER — TRANEXAMIC ACID-NACL 1000-0.7 MG/100ML-% IV SOLN
INTRAVENOUS | Status: AC
  Administered 2022-06-28: 1000 mg via INTRAVENOUS
  Filled 2022-06-28: qty 100

## 2022-06-28 MED ORDER — ENOXAPARIN SODIUM 30 MG/0.3ML IJ SOSY
30.0000 mg | PREFILLED_SYRINGE | Freq: Two times a day (BID) | INTRAMUSCULAR | Status: DC
  Administered 2022-06-29: 30 mg via SUBCUTANEOUS
  Filled 2022-06-28: qty 0.3

## 2022-06-28 MED ORDER — ACETAMINOPHEN 10 MG/ML IV SOLN
1000.0000 mg | Freq: Four times a day (QID) | INTRAVENOUS | Status: DC
  Administered 2022-06-28 – 2022-06-29 (×3): 1000 mg via INTRAVENOUS
  Filled 2022-06-28 (×3): qty 100

## 2022-06-28 MED ORDER — PROPOFOL 10 MG/ML IV BOLUS
INTRAVENOUS | Status: DC | PRN
  Administered 2022-06-28: 200 mg via INTRAVENOUS

## 2022-06-28 MED ORDER — SEVOFLURANE IN SOLN
RESPIRATORY_TRACT | Status: AC
  Filled 2022-06-28: qty 250

## 2022-06-28 MED ORDER — BISACODYL 10 MG RE SUPP: 10.0000 mg | Freq: Every day | RECTAL | Status: DC | PRN

## 2022-06-28 MED ORDER — LIDOCAINE HCL (CARDIAC) PF 100 MG/5ML IV SOSY
PREFILLED_SYRINGE | INTRAVENOUS | Status: DC | PRN
  Administered 2022-06-28: 100 mg via INTRAVENOUS

## 2022-06-28 MED ORDER — SODIUM CHLORIDE (PF) 0.9 % IJ SOLN
INTRAMUSCULAR | Status: DC | PRN
  Administered 2022-06-28: 120 mL via INTRAMUSCULAR

## 2022-06-28 MED ORDER — CEFAZOLIN SODIUM-DEXTROSE 2-4 GM/100ML-% IV SOLN
2.0000 g | Freq: Four times a day (QID) | INTRAVENOUS | Status: AC
  Administered 2022-06-28 (×2): 2 g via INTRAVENOUS
  Filled 2022-06-28 (×2): qty 100

## 2022-06-28 MED ORDER — MIDAZOLAM HCL 2 MG/2ML IJ SOLN
INTRAMUSCULAR | Status: DC | PRN
  Administered 2022-06-28 (×2): 2 mg via INTRAVENOUS

## 2022-06-28 MED ORDER — ONDANSETRON HCL 4 MG PO TABS: 4.0000 mg | ORAL_TABLET | Freq: Four times a day (QID) | ORAL | Status: DC | PRN

## 2022-06-28 MED ORDER — PHENOL 1.4 % MT LIQD: 1.0000 | OROMUCOSAL | Status: DC | PRN

## 2022-06-28 MED ORDER — SODIUM CHLORIDE 0.9 % IR SOLN
Status: DC | PRN
  Administered 2022-06-28: 3000 mL

## 2022-06-28 MED ORDER — SODIUM CHLORIDE 0.9 % IV SOLN: INTRAVENOUS | Status: DC

## 2022-06-28 MED ORDER — PROPOFOL 1000 MG/100ML IV EMUL
INTRAVENOUS | Status: AC
  Filled 2022-06-28: qty 100

## 2022-06-28 MED ORDER — MAGNESIUM HYDROXIDE 400 MG/5ML PO SUSP
30.0000 mL | Freq: Every day | ORAL | Status: DC
  Administered 2022-06-28 – 2022-06-29 (×2): 30 mL via ORAL
  Filled 2022-06-28 (×2): qty 30

## 2022-06-28 MED ORDER — PANTOPRAZOLE SODIUM 40 MG PO TBEC
40.0000 mg | DELAYED_RELEASE_TABLET | Freq: Two times a day (BID) | ORAL | Status: DC
  Administered 2022-06-28 – 2022-06-29 (×3): 40 mg via ORAL
  Filled 2022-06-28 (×3): qty 1

## 2022-06-28 MED ORDER — CELECOXIB 200 MG PO CAPS
ORAL_CAPSULE | ORAL | Status: AC
  Administered 2022-06-28: 400 mg via ORAL
  Filled 2022-06-28: qty 2

## 2022-06-28 MED ORDER — DEXAMETHASONE SODIUM PHOSPHATE 10 MG/ML IJ SOLN
INTRAMUSCULAR | Status: AC
  Administered 2022-06-28: 8 mg via INTRAVENOUS
  Filled 2022-06-28: qty 1

## 2022-06-28 MED ORDER — FLEET ENEMA 7-19 GM/118ML RE ENEM: 1.0000 | ENEMA | Freq: Once | RECTAL | Status: DC | PRN

## 2022-06-28 MED ORDER — FAMOTIDINE 20 MG PO TABS
ORAL_TABLET | ORAL | Status: AC
  Administered 2022-06-28: 20 mg via ORAL
  Filled 2022-06-28: qty 1

## 2022-06-28 MED ORDER — OXYCODONE HCL 5 MG PO TABS
ORAL_TABLET | ORAL | Status: AC
  Administered 2022-06-28: 10 mg via ORAL
  Filled 2022-06-28: qty 2

## 2022-06-28 MED ORDER — BUPIVACAINE HCL (PF) 0.25 % IJ SOLN
INTRAMUSCULAR | Status: AC
  Filled 2022-06-28: qty 120

## 2022-06-28 MED ORDER — CEFAZOLIN SODIUM-DEXTROSE 2-4 GM/100ML-% IV SOLN
INTRAVENOUS | Status: AC
  Filled 2022-06-28: qty 100

## 2022-06-28 MED ORDER — HYDROMORPHONE HCL 1 MG/ML IJ SOLN
INTRAMUSCULAR | Status: AC
  Filled 2022-06-28: qty 1

## 2022-06-28 MED ORDER — TRAMADOL HCL 50 MG PO TABS
50.0000 mg | ORAL_TABLET | ORAL | Status: DC | PRN
  Administered 2022-06-29: 100 mg via ORAL
  Filled 2022-06-28: qty 2

## 2022-06-28 MED ORDER — TRANEXAMIC ACID-NACL 1000-0.7 MG/100ML-% IV SOLN
INTRAVENOUS | Status: AC
  Filled 2022-06-28: qty 100

## 2022-06-28 MED ORDER — ACETAMINOPHEN 325 MG PO TABS: 325.0000 mg | ORAL_TABLET | Freq: Four times a day (QID) | ORAL | Status: DC | PRN

## 2022-06-28 MED ORDER — GABAPENTIN 300 MG PO CAPS
ORAL_CAPSULE | ORAL | Status: AC
  Administered 2022-06-28: 300 mg via ORAL
  Filled 2022-06-28: qty 1

## 2022-06-28 MED ORDER — SURGIPHOR WOUND IRRIGATION SYSTEM - OPTIME
TOPICAL | Status: DC | PRN
  Administered 2022-06-28: 1

## 2022-06-28 MED ORDER — HYDROMORPHONE HCL 1 MG/ML IJ SOLN
0.5000 mg | INTRAMUSCULAR | Status: DC | PRN
  Administered 2022-06-28 – 2022-06-29 (×3): 1 mg via INTRAVENOUS
  Filled 2022-06-28 (×3): qty 1

## 2022-06-28 MED ORDER — FENTANYL CITRATE (PF) 100 MCG/2ML IJ SOLN
25.0000 ug | INTRAMUSCULAR | Status: DC | PRN
  Administered 2022-06-28: 50 ug via INTRAVENOUS

## 2022-06-28 MED ORDER — FERROUS SULFATE 325 (65 FE) MG PO TABS
325.0000 mg | ORAL_TABLET | Freq: Two times a day (BID) | ORAL | Status: DC
  Administered 2022-06-28 – 2022-06-29 (×2): 325 mg via ORAL
  Filled 2022-06-28 (×2): qty 1

## 2022-06-28 MED ORDER — PROMETHAZINE HCL 25 MG/ML IJ SOLN: 6.2500 mg | INTRAMUSCULAR | Status: DC | PRN

## 2022-06-28 MED ORDER — ONDANSETRON HCL 4 MG/2ML IJ SOLN: 4.0000 mg | Freq: Four times a day (QID) | INTRAMUSCULAR | Status: DC | PRN

## 2022-06-28 MED ORDER — METOPROLOL TARTRATE 50 MG PO TABS
100.0000 mg | ORAL_TABLET | Freq: Every morning | ORAL | Status: DC
Start: 2022-06-29 — End: 2022-06-28

## 2022-06-28 MED ORDER — MENTHOL 3 MG MT LOZG: 1.0000 | LOZENGE | OROMUCOSAL | Status: DC | PRN

## 2022-06-28 MED ORDER — SENNOSIDES-DOCUSATE SODIUM 8.6-50 MG PO TABS
1.0000 | ORAL_TABLET | Freq: Two times a day (BID) | ORAL | Status: DC
  Administered 2022-06-28 – 2022-06-29 (×3): 1 via ORAL
  Filled 2022-06-28 (×3): qty 1

## 2022-06-28 SURGICAL SUPPLY — 76 items
ATTUNE MED DOME PAT 41 KNEE (Knees) ×1 IMPLANT
ATTUNE PS FEM RT SZ 7 CEM KNEE (Femur) ×1 IMPLANT
ATTUNE PS FEM RT SZ 8 CEM KNEE (Femur) ×1 IMPLANT
ATTUNE PSRP INSR SZ8 5 KNEE (Insert) ×1 IMPLANT
BASE TIBIAL ROT PLAT SZ 8 KNEE (Knees) IMPLANT
BATTERY INSTRU NAVIGATION (MISCELLANEOUS) ×8 IMPLANT
BLADE CLIPPER SURG (BLADE) ×1 IMPLANT
BLADE SAW 70X12.5 (BLADE) ×2 IMPLANT
BLADE SAW 90X13X1.19 OSCILLAT (BLADE) ×2 IMPLANT
BLADE SAW 90X25X1.19 OSCILLAT (BLADE) ×2 IMPLANT
BSPLAT TIB 8 CMNT ROT PLAT STR (Knees) ×1 IMPLANT
BTRY SRG DRVR LF (MISCELLANEOUS) ×4
CEMENT HV SMART SET (Cement) ×4 IMPLANT
COOLER POLAR GLACIER W/PUMP (MISCELLANEOUS) ×2 IMPLANT
CUFF TOURN SGL QUICK 24 (TOURNIQUET CUFF)
CUFF TOURN SGL QUICK 34 (TOURNIQUET CUFF)
CUFF TRNQT CYL 24X4X16.5-23 (TOURNIQUET CUFF) IMPLANT
CUFF TRNQT CYL 34X4.125X (TOURNIQUET CUFF) IMPLANT
DRAPE 3/4 80X56 (DRAPES) ×2 IMPLANT
DRAPE INCISE IOBAN 66X45 STRL (DRAPES) IMPLANT
DRSG DERMACEA NONADH 3X8 (GAUZE/BANDAGES/DRESSINGS) ×2 IMPLANT
DRSG MEPILEX SACRM 8.7X9.8 (GAUZE/BANDAGES/DRESSINGS) ×1 IMPLANT
DRSG OPSITE POSTOP 4X14 (GAUZE/BANDAGES/DRESSINGS) ×2 IMPLANT
DRSG TEGADERM 4X4.75 (GAUZE/BANDAGES/DRESSINGS) ×2 IMPLANT
DURAPREP 26ML APPLICATOR (WOUND CARE) ×4 IMPLANT
ELECT CAUTERY BLADE 6.4 (BLADE) ×2 IMPLANT
ELECT REM PT RETURN 9FT ADLT (ELECTROSURGICAL) ×2
ELECTRODE REM PT RTRN 9FT ADLT (ELECTROSURGICAL) ×1 IMPLANT
EX-PIN ORTHOLOCK NAV 4X150 (PIN) ×4 IMPLANT
GLOVE BIOGEL M STRL SZ7.5 (GLOVE) ×6 IMPLANT
GLOVE BIOGEL PI IND STRL 8 (GLOVE) ×1 IMPLANT
GLOVE BIOGEL PI INDICATOR 8 (GLOVE) ×1
GLOVE SURG UNDER POLY LF SZ7.5 (GLOVE) ×2 IMPLANT
GOWN STRL REUS W/ TWL LRG LVL3 (GOWN DISPOSABLE) ×2 IMPLANT
GOWN STRL REUS W/ TWL XL LVL3 (GOWN DISPOSABLE) ×1 IMPLANT
GOWN STRL REUS W/TWL LRG LVL3 (GOWN DISPOSABLE) ×4
GOWN STRL REUS W/TWL XL LVL3 (GOWN DISPOSABLE) ×2
HEMOVAC 400CC 10FR (MISCELLANEOUS) ×2 IMPLANT
HOLDER FOLEY CATH W/STRAP (MISCELLANEOUS) ×2 IMPLANT
HOLSTER ELECTROSUGICAL PENCIL (MISCELLANEOUS) ×1 IMPLANT
HOOD PEEL AWAY FLYTE STAYCOOL (MISCELLANEOUS) ×6 IMPLANT
IV NS IRRIG 3000ML ARTHROMATIC (IV SOLUTION) ×2 IMPLANT
KIT TURNOVER KIT A (KITS) ×2 IMPLANT
KNIFE SCULPS 14X20 (INSTRUMENTS) ×2 IMPLANT
MANIFOLD NEPTUNE II (INSTRUMENTS) ×4 IMPLANT
NDL SPNL 20GX3.5 QUINCKE YW (NEEDLE) ×2 IMPLANT
NEEDLE SPNL 20GX3.5 QUINCKE YW (NEEDLE) ×4 IMPLANT
NS IRRIG 500ML POUR BTL (IV SOLUTION) ×2 IMPLANT
PACK TOTAL KNEE (MISCELLANEOUS) ×2 IMPLANT
PAD ABD DERMACEA PRESS 5X9 (GAUZE/BANDAGES/DRESSINGS) ×4 IMPLANT
PAD WRAPON POLAR KNEE (MISCELLANEOUS) ×1 IMPLANT
PIN DRILL FIX HALF THREAD (BIT) ×4 IMPLANT
PIN FIXATION 1/8DIA X 3INL (PIN) ×2 IMPLANT
PULSAVAC PLUS IRRIG FAN TIP (DISPOSABLE) ×2
SOL PREP PVP 2OZ (MISCELLANEOUS) ×2
SOLUTION IRRIG SURGIPHOR (IV SOLUTION) ×2 IMPLANT
SOLUTION PREP PVP 2OZ (MISCELLANEOUS) ×1 IMPLANT
SPONGE DRAIN TRACH 4X4 STRL 2S (GAUZE/BANDAGES/DRESSINGS) ×2 IMPLANT
SPONGE T-LAP 18X18 ~~LOC~~+RFID (SPONGE) ×1 IMPLANT
STAPLER SKIN PROX 35W (STAPLE) ×2 IMPLANT
STOCKINETTE IMPERV 14X48 (MISCELLANEOUS) ×1 IMPLANT
STRAP TIBIA SHORT (MISCELLANEOUS) ×2 IMPLANT
SUCTION FRAZIER HANDLE 10FR (MISCELLANEOUS) ×2
SUCTION TUBE FRAZIER 10FR DISP (MISCELLANEOUS) ×1 IMPLANT
SUT VIC AB 0 CT1 36 (SUTURE) ×4 IMPLANT
SUT VIC AB 1 CT1 36 (SUTURE) ×4 IMPLANT
SUT VIC AB 2-0 CT2 27 (SUTURE) ×2 IMPLANT
SYR 30ML LL (SYRINGE) ×4 IMPLANT
TIBIAL BASE ROT PLAT SZ 8 KNEE (Knees) ×2 IMPLANT
TIP FAN IRRIG PULSAVAC PLUS (DISPOSABLE) ×1 IMPLANT
TOWEL OR 17X26 4PK STRL BLUE (TOWEL DISPOSABLE) ×1 IMPLANT
TOWER CARTRIDGE SMART MIX (DISPOSABLE) ×3 IMPLANT
TRAY FOLEY MTR SLVR 16FR STAT (SET/KITS/TRAYS/PACK) ×2 IMPLANT
WATER STERILE IRR 1000ML POUR (IV SOLUTION) ×1 IMPLANT
WATER STERILE IRR 500ML POUR (IV SOLUTION) ×1 IMPLANT
WRAPON POLAR PAD KNEE (MISCELLANEOUS) ×2

## 2022-06-28 NOTE — Anesthesia Preprocedure Evaluation (Signed)
Anesthesia Evaluation  Patient identified by MRN, date of birth, ID band Patient awake    Reviewed: Allergy & Precautions, H&P , NPO status , Patient's Chart, lab work & pertinent test results  History of Anesthesia Complications Negative for: history of anesthetic complications  Airway Mallampati: III  TM Distance: <3 FB Neck ROM: full    Dental  (+) Chipped, Poor Dentition, Dental Advidsory Given   Pulmonary neg shortness of breath, asthma , neg sleep apnea, neg recent URI,           Cardiovascular Exercise Tolerance: Good hypertension, (-) angina(-) Past MI and (-) DOE (-) dysrhythmias (-) Valvular Problems/Murmurs     Neuro/Psych  Headaches, neg Seizures PSYCHIATRIC DISORDERS Anxiety Depression    GI/Hepatic Neg liver ROS, GERD  Medicated and Controlled,  Endo/Other  negative endocrine ROS  Renal/GU negative Renal ROS     Musculoskeletal  (+) Arthritis ,   Abdominal   Peds  Hematology negative hematology ROS (+)   Anesthesia Other Findings Past Medical History: No date: Anxiety No date: Arthritis     Comment:  BIL KNEES No date: Asthma     Comment:  WELL CONTROLLED No date: Depression No date: GERD (gastroesophageal reflux disease)     Comment:  OCC No date: Headache     Comment:  H/O MIGRAINES No date: Hypertension  Past Surgical History: No date: FINGER SURGERY No date: THUMB SURGERY No date: TOE SURGERY  BMI    Body Mass Index:  31.75 kg/m      Reproductive/Obstetrics negative OB ROS                             Anesthesia Physical  Anesthesia Plan  ASA: 3  Anesthesia Plan: General   Post-op Pain Management:    Induction: Intravenous  PONV Risk Score and Plan: 2 and Ondansetron, Dexamethasone and Midazolam  Airway Management Planned: Oral ETT  Additional Equipment:   Intra-op Plan:   Post-operative Plan: Extubation in OR  Informed Consent: I have  reviewed the patients History and Physical, chart, labs and discussed the procedure including the risks, benefits and alternatives for the proposed anesthesia with the patient or authorized representative who has indicated his/her understanding and acceptance.     Dental Advisory Given  Plan Discussed with: Anesthesiologist, CRNA and Surgeon  Anesthesia Plan Comments: (Patient consented for risks of anesthesia including but not limited to:  - adverse reactions to medications - damage to teeth, lips or other oral mucosa - sore throat or hoarseness - Damage to heart, brain, lungs or loss of life  Patient voiced understanding.)        Anesthesia Quick Evaluation

## 2022-06-28 NOTE — H&P (Signed)
The patient has been re-examined, and the chart reviewed, and there have been no interval changes to the documented history and physical.    The risks, benefits, and alternatives have been discussed at length. The patient expressed understanding of the risks benefits and agreed with plans for surgical intervention.  Cody Huang, Jr. M.D.    

## 2022-06-28 NOTE — Evaluation (Signed)
Physical Therapy Evaluation Patient Details Name: Cody Huang MRN: [id number] DOB: [birth year] Today's Date: 06/28/2022  History of Present Illness  Cody Huang is a 51yoM who comes to [facility] on 06/28/22 for elective Rt TKA with Dr. Francesco Sor. PTA pt was still driving, working, walking as tolerated without device, but very limited in pain by end of most days.  Clinical Impression  Pt admitted with above diagnosis. Pt currently with functional limitations due to the deficits listed below (see "PT Problem List"). Upon entry, pt in bed, awake and agreeable to participate. The pt is alert, pleasant, interactive, and able to provide info regarding prior level of function, both in tolerance and independence. Pt given assistance for ROM exercises in bed, education on limb positioning. ModI mobility to EOB, then supervision to minGuard STS from standard height with RW. Pt reports relief and pleasant feelings in weightbearing. Pt able to AMB 42ft fwd and then 54ft backward, then elects to sit in recliner due to some shakiness. Pt lef tup in recliner with wife and NSG in room. Patient's performance this date reveals decreased ability, independence, and tolerance in performing all basic mobility required for performance of activities of daily living. Pt requires additional DME, close physical assistance, and cues for safe participate in mobility. Pt will benefit from skilled PT intervention to increase independence and safety with basic mobility in preparation for discharge to the venue listed below.     No data found.      Recommendations for follow up therapy are one component of a multi-disciplinary discharge planning process, led by the attending physician.  Recommendations may be updated based on patient status, additional functional criteria and insurance authorization.  Follow Up Recommendations Follow physician's recommendations for discharge plan and follow up therapies      Assistance Recommended at  Discharge Set up Supervision/Assistance  Patient can return home with the following  Help with stairs or ramp for entrance;Assist for transportation;Assistance with cooking/housework    Equipment Recommendations Rolling walker (2 wheels);BSC/3in1  Recommendations for Other Services       Functional Status Assessment Patient has had a recent decline in their functional status and demonstrates the ability to make significant improvements in function in a reasonable and predictable amount of time.     Precautions / Restrictions Precautions Precautions: Fall Restrictions Weight Bearing Restrictions: Yes RLE Weight Bearing: Weight bearing as tolerated      Mobility  Bed Mobility Overal bed mobility: Modified Independent Bed Mobility: Supine to Sit     Supine to sit: Modified independent (Device/Increase time)     General bed mobility comments: essentially performs a SLR to move RLE to Left EOB. slow, labored, byt physically able.    Transfers Overall transfer level: Needs assistance Equipment used: Rolling walker (2 wheels) Transfers: Sit to/from Stand Sit to Stand: Min guard           General transfer comment: safe, paced performance, no LOB, reports to feel good to straighten knee    Ambulation/Gait Ambulation/Gait assistance: Min guard Gait Distance (Feet): 12 Feet Assistive device: Rolling walker (2 wheels) Gait Pattern/deviations: Step-to pattern       General Gait Details: minimal use of RW only for safety, generally good balance and control, cues for reducing ABDCT RLE  Stairs            Wheelchair Mobility    Modified Rankin (Stroke Patients Only)       Balance Overall balance assessment: Modified Independent, No apparent balance deficits (not  formally assessed)                                           Pertinent Vitals/Pain Pain Assessment Pain Assessment: 0-10 Pain Score: 6  Pain Location: Rt post knee ache Pain  Descriptors / Indicators: Aching Pain Intervention(s): Limited activity within patient's tolerance, Monitored during session, Premedicated before session, Repositioned    Home Living Family/patient expects to be discharged to:: Private residence Living Arrangements: Spouse/significant other Available Help at Discharge: Family Type of Home: House Home Access: Stairs to enter Entrance Stairs-Rails: Doctor, general practice of Steps: 8   Home Layout: One level Home Equipment: BSC/3in1;Crutches      Prior Function Prior Level of Function : Independent/Modified Independent;Driving;Working/employed                     Hand Dominance        Extremity/Trunk Hydrologist Sets: AROM, Right, 10 reps, Supine Gluteal Sets: AROM, Right, 10 reps, Supine Heel Slides: AAROM, Right, 10 reps, Supine Hip ABduction/ADduction: AAROM, Right, 10 reps, Supine, AROM Goniometric ROM: 14-61 degrees Rt knee flexion   Assessment/Plan    PT Assessment Patient needs continued PT services  PT Problem List Decreased strength;Decreased range of motion;Decreased activity tolerance;Decreased balance;Decreased mobility;Decreased coordination;Decreased knowledge of use of DME;Decreased safety awareness;Decreased knowledge of precautions       PT Treatment Interventions DME instruction;Gait training;Stair training;Functional mobility training;Therapeutic activities;Therapeutic exercise;Patient/family education;Balance training    PT Goals (Current goals can be found in the Care Plan section)  Acute Rehab PT Goals Patient Stated Goal: progress to AMB independently at home PT Goal Formulation: With patient Time For Goal Achievement: 07/12/22 Potential to Achieve Goals: Good    Frequency BID     Co-evaluation                AM-PAC PT "6 Clicks" Mobility  Outcome Measure Help needed turning from your back to your side while in a flat bed without using bedrails?: A Little Help needed moving from lying on your back to sitting on the side of a flat bed without using bedrails?: A Little Help needed moving to and from a bed to a chair (including a wheelchair)?: A Little Help needed standing up from a chair using your arms (e.g., wheelchair or bedside chair)?: A Little Help needed to walk in hospital room?: A Little Help needed climbing 3-5 steps with a railing? : A Little 6 Click Score: 18    End of Session   Activity Tolerance: Patient tolerated treatment well;No increased pain Patient left: in chair;with family/visitor present;with nursing/sitter in room;with call bell/phone within reach Nurse Communication: Mobility status PT Visit Diagnosis: Unsteadiness on feet (R26.81);Difficulty in walking, not elsewhere classified (R26.2);Other abnormalities of gait and mobility (R26.89);Muscle weakness (generalized) (M62.81)    Time: 9147-8295 PT Time Calculation (min) (ACUTE ONLY): 35 min   Charges:   PT Evaluation $PT Eval Low Complexity:  1 Low PT Treatments $Gait Training: 8-22 mins $Therapeutic Exercise: 8-22 mins      4:31 PM, 06/28/22 Rosamaria Lints, PT, DPT Physical Therapist - [facility]  [phone] (ASCOM)    Hosie Sharman C 06/28/2022, 4:28 PM

## 2022-06-28 NOTE — Transfer of Care (Signed)
Immediate Anesthesia Transfer of Care Note  Patient: Cody Huang  Procedure(s) Performed: COMPUTER ASSISTED TOTAL KNEE ARTHROPLASTY (Right: Knee)  Patient Location: PACU  Anesthesia Type:General  Level of Consciousness: awake, alert  and oriented  Airway & Oxygen Therapy: Patient Spontanous Breathing and Patient connected to face mask  Post-op Assessment: Report given to RN and Post -op Vital signs reviewed and stable  Post vital signs: Reviewed and stable  Last Vitals:  Vitals Value Taken Time  BP 138/65 06/28/22 1150  Temp    Pulse 86 06/28/22 1156  Resp 17 06/28/22 1156  SpO2 96 % 06/28/22 1156  Vitals shown include unvalidated device data.  Last Pain:  Vitals:   06/28/22 0621  TempSrc: Temporal  PainSc: 5          Complications: No notable events documented.

## 2022-06-28 NOTE — H&P (Signed)
ORTHOPAEDIC HISTORY & PHYSICAL Michelene Gardener, [place] - 06/14/2022 11:00 AM EDT Formatting of this note is different from the original. [facility] Chief Complaint:   Chief Complaint  Patient presents with  Knee Pain  H & P RIGHT KNEE   History of Present Illness:   Cody Huang is a 51 y.o. male that presents to clinic today for his preoperative history and evaluation. Patient presents unaccompanied. The patient is scheduled to undergo a right total knee arthroplasty on 06/28/22 by Dr. Ernest Pine. His pain began several years ago. The pain is located primarily along the medial aspect of the knee. He describes his pain as worse with weightbearing. He reports associated swelling with some giving way of the knees. He denies associated numbness or tingling, denies locking.   The patient's symptoms have progressed to the point that they decrease his quality of life. The patient has previously undergone conservative treatment including NSAIDS and injections to the knee without adequate control of his symptoms.  Denies history of lumbar surgery, significant cardiac history, history of DVT.   Patient states he does not want a spinal for anesthesia. Patient reports allergy to penicillin, including rash and decreased heart rate.  Past Medical, Surgical, Family, Social History, Allergies, Medications:   Past Medical History:  Past Medical History:  Diagnosis Date  Anxiety  Back pain, chronic  Chicken pox  Hypertension  Migraines  Mild intermittent asthma  Osteoarthritis  Panic attacks  Seasonal allergies  Splenomegaly   Past Surgical History:  Past Surgical History:  Procedure Laterality Date  Left knee arthroscopy 07/15/2018  Dr Davy Pique [facility]  hand surgery  Left big toe surgery   Current Medications:  Current Outpatient Medications  Medication Sig Dispense Refill  albuterol 90 mcg/actuation inhaler Inhale 2 inhalations into the lungs  every 4 (four) hours as needed for Wheezing or Shortness of Breath 1 each 0  budesonide-formoteroL (SYMBICORT) 80-4.5 mcg/actuation inhaler Inhale 2 inhalations into the lungs 2 (two) times daily 10.2 g 12  ibuprofen (MOTRIN) 200 MG tablet Take 800 mg by mouth 2 (two) times daily  loratadine (CLARITIN) 10 mg tablet Take 10 mg by mouth once daily  metoprolol tartrate (LOPRESSOR) 50 MG tablet TAKE 1 TABLET(50 MG) BY MOUTH TWICE DAILY 180 tablet 1  oxymetazoline (AFRIN) 0.05 % nasal spray Place 1 spray into both nostrils once daily as needed for Congestion  sildenafiL (VIAGRA) 50 MG tablet Take 50 mg by mouth once daily as needed for Erectile Dysfunction   No current facility-administered medications for this visit.   Allergies:  Allergies  Allergen Reactions  Aspirin Hives  Penicillins Rash  Bradycardia   Social History:  Social History   Socioeconomic History  Marital status: Married  Spouse name: Toniann Fail  Number of children: 1  Years of education: 16  Highest education level: Bachelor's degree (e.g., BA, AB, BS)  Occupational History  Occupation: Full-time- Conservation officer, historic buildings  Tobacco Use  Smoking status: Never  Smokeless tobacco: Current  Types: Chew  Tobacco comments:  chews 1/2 can per day 20+ years  Vaping Use  Vaping Use: Never used  Substance and Sexual Activity  Alcohol use: Yes  Alcohol/week: 4.0 - 6.0 standard drinks  Types: 4 - 6 Cans of beer per week  Comment: 4-6 cans of beer  Drug use: Defer  Sexual activity: Yes  Partners: Female  Social History Narrative  Married, works in [employer] representative.   Family History:  Family History  Problem Relation Age  of Onset  No Known Problems Mother  No Known Problems Father   Review of Systems:   A 10+ ROS was performed, reviewed, and the pertinent orthopaedic findings are documented in the HPI.   Physical Examination:   BP (!) 142/92 (BP Location: Left upper arm, Patient Position: Sitting, BP Cuff Size: Adult)  Ht  188 cm (6\' 2" )  Wt (!) 114.7 kg (252 lb 12.8 oz)  BMI 32.46 kg/m   Patient is a well-developed, well-nourished male in no acute distress. Patient has normal mood and affect. Patient is alert and oriented to person, place, and time.   HEENT: Atraumatic, normocephalic. Pupils equal and reactive to light. Extraocular motion intact. Noninjected sclera.  Cardiovascular: Regular rate and rhythm, with no murmurs, rubs, or gallops. Distal pulses palpable.  Respiratory: Lungs clear to auscultation bilaterally.   Right Knee: Soft tissue swelling: mild Effusion: minimal Erythema: none Crepitance: mild Tenderness: medial Alignment: relative varus Mediolateral laxity: medial pseudolaxity Posterior sag: negative Patellar tracking: Good tracking without evidence of subluxation or tilt Atrophy: No significant atrophy.  Quadriceps tone was good. Range of motion: 0/5/125 degrees   Sensation intact over the saphenous, lateral sural cutaneous, superficial fibular, and deep fibular nerve distributions.  Tests Performed/Reviewed:  X-rays  3 views of the right knee were obtained. Images reveal severe loss of medial compartment joint space with significant osteophyte formation. No fractures or dislocations. No osseous abnormality noted.  I personally ordered and interpreted today's x-rays.  Impression:   ICD-10-CM  1. Primary osteoarthritis of right knee M17.11   Plan:   The patient has end-stage degenerative changes of the right knee. It was explained to the patient that the condition is progressive in nature. Having failed conservative treatment, the patient has elected to proceed with a total joint arthroplasty. The patient will undergo a total joint arthroplasty with Dr. 04-12-1971. The risks of surgery, including blood clot and infection, were discussed with the patient. Measures to reduce these risks, including the use of anticoagulation, perioperative antibiotics, and early ambulation were  discussed. The importance of postoperative physical therapy was discussed with the patient. The patient elects to proceed with surgery. The patient is instructed to stop all blood thinners prior to surgery. The patient is instructed to call the hospital the day before surgery to learn of the proper arrival time.   Contact our office with any questions or concerns. Follow up as indicated, or sooner should any new problems arise, if conditions worsen, or if they are otherwise concerned.   Ernest Pine, PA -C [facility] [address] [place], [place] Phone: [phone]  This note was generated in part with voice recognition software and I apologize for any typographical errors that were not detected and corrected.  Electronically signed by [phone], PA at 06/16/2022 10:53 AM EDT

## 2022-06-28 NOTE — TOC Progression Note (Signed)
Transition of Care [facility]) - Progression Note    Patient Details  Name: Cody Huang MRN: [id number] Date of Birth: January 26, 1971  Transition of Care [facility]) CM/SW Contact  Marlowe Sax, RN Phone Number: 06/28/2022, 1:46 PM  Clinical Narrative:     Patient is set up with [facility] for [facility] Services prior to surgery       Expected Discharge Plan and Services                                                 Social Determinants of Health (SDOH) Interventions    Readmission Risk Interventions     No data to display

## 2022-06-28 NOTE — Op Note (Signed)
OPERATIVE NOTE  DATE OF SURGERY:  06/28/2022  PATIENT NAME:  Cody Huang   DOB: 02/12/1971  MRN: [id number]  PRE-OPERATIVE DIAGNOSIS: Degenerative arthrosis of the right knee, primary  POST-OPERATIVE DIAGNOSIS:  Same  PROCEDURE:  Right total knee arthroplasty using computer-assisted navigation  SURGEON:  Jena Gauss. M.D.  ANESTHESIA: general  ESTIMATED BLOOD LOSS: 50 mL  FLUIDS REPLACED: 1600 mL of crystalloid  TOURNIQUET TIME: 107 minutes  DRAINS: 2 medium Hemovac drains  SOFT TISSUE RELEASES: Anterior cruciate ligament, posterior cruciate ligament, deep medial collateral ligament, patellofemoral ligament  IMPLANTS UTILIZED: DePuy Attune size 8 posterior stabilized femoral component (cemented), size 8 rotating platform tibial component (cemented), 41 mm medialized dome patella (cemented), and a 5 mm stabilized rotating platform polyethylene insert.  INDICATIONS FOR SURGERY: Cody Huang is a 51 y.o. year old male with a long history of progressive knee pain. X-rays demonstrated severe degenerative changes in tricompartmental fashion. The patient had not seen any significant improvement despite conservative nonsurgical intervention. After discussion of the risks and benefits of surgical intervention, the patient expressed understanding of the risks benefits and agree with plans for total knee arthroplasty.   The risks, benefits, and alternatives were discussed at length including but not limited to the risks of infection, bleeding, nerve injury, stiffness, blood clots, the need for revision surgery, cardiopulmonary complications, among others, and they were willing to proceed.  PROCEDURE IN DETAIL: The patient was brought into the operating room and, after adequate general anesthesia was achieved, a tourniquet was placed on the patient's upper thigh. The patient's knee and leg were cleaned and prepped with alcohol and DuraPrep and draped in the usual sterile fashion. A  "timeout" was performed as per usual protocol. The lower extremity was exsanguinated using an Esmarch, and the tourniquet was inflated to 300 mmHg. An anterior longitudinal incision was made followed by a standard mid vastus approach. The deep fibers of the medial collateral ligament were elevated in a subperiosteal fashion off of the medial flare of the tibia so as to maintain a continuous soft tissue sleeve. The patella was subluxed laterally and the patellofemoral ligament was incised. Inspection of the knee demonstrated severe degenerative changes with full-thickness loss of articular cartilage. Osteophytes were debrided using a rongeur. Anterior and posterior cruciate ligaments were excised. Two 4.0 mm Schanz pins were inserted in the femur and into the tibia for attachment of the array of trackers used for computer-assisted navigation. Hip center was identified using a circumduction technique. Distal landmarks were mapped using the computer. The distal femur and proximal tibia were mapped using the computer. The distal femoral cutting guide was positioned using computer-assisted navigation so as to achieve a 5 distal valgus cut. The femur was sized and it was felt that a size 8 femoral component was appropriate. A size 8 femoral cutting guide was positioned and the anterior cut was performed and verified using the computer. This was followed by completion of the posterior and chamfer cuts. Femoral cutting guide for the central box was then positioned in the center box cut was performed.  Attention was then directed to the proximal tibia. Medial and lateral menisci were excised. The extramedullary tibial cutting guide was positioned using computer-assisted navigation so as to achieve a 0 varus-valgus alignment and 3 posterior slope. The cut was performed and verified using the computer. The proximal tibia was sized and it was felt that a size 8 tibial tray was appropriate. Tibial and femoral trials were  inserted followed  by insertion of a 5 mm polyethylene insert. This allowed for excellent mediolateral soft tissue balancing both in flexion and in full extension. Finally, the patella was cut and prepared so as to accommodate a 41 mm medialized dome patella. A patella trial was placed and the knee was placed through a range of motion with excellent patellar tracking appreciated. The femoral trial was removed after debridement of posterior osteophytes. The central post-hole for the tibial component was reamed followed by insertion of a keel punch. Tibial trials were then removed. Cut surfaces of bone were irrigated with copious amounts of normal saline using pulsatile lavage and then suctioned dry. Polymethylmethacrylate cement was prepared in the usual fashion using a vacuum mixer. Cement was applied to the cut surface of the proximal tibia as well as along the undersurface of a size 8 rotating platform tibial component. Tibial component was positioned and impacted into place. Excess cement was removed using Personal assistant. Cement was then applied to the cut surfaces of the femur as well as along the posterior flanges of the size 8 femoral component. The femoral component was positioned and impacted into place. Excess cement was removed using Personal assistant. A 5 mm polyethylene trial was inserted and the knee was brought into full extension with steady axial compression applied. Finally, cement was applied to the backside of a 41 mm medialized dome patella and the patellar component was positioned and patellar clamp applied. Excess cement was removed using Personal assistant. After adequate curing of the cement, the tourniquet was deflated after a total tourniquet time of 107 minutes. Hemostasis was achieved using electrocautery. The knee was irrigated with copious amounts of normal saline using pulsatile lavage followed by 450 ml of Surgiphor and then suctioned dry. 20 mL of 1.3% Exparel and 60 mL of 0.25% Marcaine  in 40 mL of normal saline was injected along the posterior capsule, medial and lateral gutters, and along the arthrotomy site. A 5 mm stabilized rotating platform polyethylene insert was inserted and the knee was placed through a range of motion with excellent mediolateral soft tissue balancing appreciated and excellent patellar tracking noted. 2 medium drains were placed in the wound bed and brought out through separate stab incisions. The medial parapatellar portion of the incision was reapproximated using interrupted sutures of #1 Vicryl. Subcutaneous tissue was approximated in layers using first #0 Vicryl followed #2-0 Vicryl. The skin was approximated with skin staples. A sterile dressing was applied.  The patient tolerated the procedure well and was transported to the recovery room in stable condition.    Thierno Hun P. Angie Fava., M.D.

## 2022-06-28 NOTE — Anesthesia Procedure Notes (Signed)
Procedure Name: Intubation Date/Time: 06/28/2022 8:03 AM  Performed by: Junious Silk, CRNAPre-anesthesia Checklist: Patient identified, Patient being monitored, Timeout performed, Emergency Drugs available and Suction available Patient Re-evaluated:Patient Re-evaluated prior to induction Oxygen Delivery Method: Circle system utilized Preoxygenation: Pre-oxygenation with 100% oxygen Induction Type: IV induction Ventilation: Mask ventilation without difficulty Laryngoscope Size: McGraph and 4 Grade View: Grade I Tube type: Oral Tube size: 7.5 mm Number of attempts: 1 Airway Equipment and Method: Stylet Placement Confirmation: ETT inserted through vocal cords under direct vision, positive ETCO2 and breath sounds checked- equal and bilateral Secured at: 22 cm Tube secured with: Tape Dental Injury: Teeth and Oropharynx as per pre-operative assessment  Comments: Placed by Lenis Noon SRNA

## 2022-06-28 NOTE — Plan of Care (Signed)
  Problem: Health Behavior/Discharge Planning: Goal: Ability to manage health-related needs will improve Outcome: Progressing   Problem: Coping: Goal: Level of anxiety will decrease Outcome: Progressing   Problem: Pain Managment: Goal: General experience of comfort will improve Outcome: Progressing   Problem: Safety: Goal: Ability to remain free from injury will improve Outcome: Progressing   Problem: Skin Integrity: Goal: Risk for impaired skin integrity will decrease Outcome: Progressing   Problem: Activity: Goal: Risk for activity intolerance will decrease Outcome: Progressing

## 2022-06-29 DIAGNOSIS — M1711 Unilateral primary osteoarthritis, right knee: Secondary | ICD-10-CM | POA: Diagnosis not present

## 2022-06-29 MED ORDER — ONDANSETRON HCL 4 MG PO TABS: 4.0000 mg | ORAL_TABLET | Freq: Four times a day (QID) | ORAL | 0 refills | Status: DC | PRN

## 2022-06-29 MED ORDER — CELECOXIB 200 MG PO CAPS: 200.0000 mg | ORAL_CAPSULE | Freq: Two times a day (BID) | ORAL | 0 refills | Status: DC

## 2022-06-29 MED ORDER — TIZANIDINE HCL 4 MG PO TABS: 4.0000 mg | ORAL_TABLET | Freq: Four times a day (QID) | ORAL | 0 refills | Status: AC | PRN

## 2022-06-29 MED ORDER — OXYCODONE HCL 5 MG PO TABS: 5.0000 mg | ORAL_TABLET | ORAL | 0 refills | Status: DC | PRN

## 2022-06-29 MED ORDER — TRAMADOL HCL 50 MG PO TABS: 50.0000 mg | ORAL_TABLET | ORAL | 0 refills | Status: DC | PRN

## 2022-06-29 MED ORDER — ACETAMINOPHEN 500 MG PO TABS: 500.0000 mg | ORAL_TABLET | Freq: Four times a day (QID) | ORAL | 0 refills | Status: AC | PRN

## 2022-06-29 MED ORDER — ENOXAPARIN SODIUM 40 MG/0.4ML IJ SOSY: 40.0000 mg | PREFILLED_SYRINGE | INTRAMUSCULAR | 0 refills | Status: DC

## 2022-06-29 NOTE — Evaluation (Signed)
Occupational Therapy Evaluation Patient Details Name: Cody Huang MRN: [id number] DOB: [birth year] Today's Date: 06/29/2022   History of Present Illness Pt. is a 51 yo Male who comes to [facility] on 06/28/22 for elective Rt TKA with Dr. Francesco Sor. PTA pt was still driving, working, walking as tolerated without device, but very limited in pain by end of most days.    Clinical Impression  Pt. presents with limited LE ROM, and limited functional mobility which hinders his ability to complete LE ADL tasks, and IADL functioning.  Pt. Resides at home with his wife. Pt. Was independent with ADLs, and IADL functioning: including meal preparation, and medication management. Pt. Was able to drive and was working  the Contract[employer]. Pt. Is independent UE ADL tasks, and requires ModA LE ADLs secondary to pain, and limited ROM. Pt. Education was provided about work simplification strategies for ADLs, and IADL tasks, and A/E use for LE ADL tasks. Pt. will benefit from OT services for ADL training, review of A/E for LE ADLs, and pt. Education about home modification, and DME. Pt. Plans to return home upon discharge, with family assistance as needed. No follow-up OT services are warranted at this time.      Recommendations for follow up therapy are one component of a multi-disciplinary discharge planning process, led by the attending physician.  Recommendations may be updated based on patient status, additional functional criteria and insurance authorization.   Follow Up Recommendations  No OT follow up    Assistance Recommended at Discharge    Patient can return home with the following A little help with walking and/or transfers;Assistance with cooking/housework;A lot of help with bathing/dressing/bathroom;Assist for transportation    Functional Status Assessment     Equipment Recommendations  BSC/3in1;Tub/shower seat    Recommendations for Other Services       Precautions /  Restrictions Precautions Precautions: Fall Restrictions Weight Bearing Restrictions: Yes RLE Weight Bearing: Weight bearing as tolerated      Mobility Bed Mobility               General bed mobility comments: Pt. up in chair upon arrival    Transfers Overall transfer level: Needs assistance Equipment used: Rolling walker (2 wheels) Transfers: Sit to/from Stand Sit to Stand: Supervision           General transfer comment: Mobility per PT report      Balance                                           ADL either performed or assessed with clinical judgement   ADL Overall ADL's : Needs assistance/impaired                                       General ADL Comments: Independent UE , ModA Right LE ADLs     Vision Baseline Vision/History: 1 Wears glasses Patient Visual Report: No change from baseline       Perception     Praxis      Pertinent Vitals/Pain Pain Assessment Pain Score: 7  Pain Location: Back of Right knee Pain Descriptors / Indicators: Aching Pain Intervention(s): Limited activity within patient's tolerance, Monitored during session, Repositioned     Hand Dominance Right   Extremity/Trunk Assessment Upper Extremity Assessment Upper  Extremity Assessment: Overall WFL for tasks assessed           Communication Communication Communication: No difficulties   Cognition Arousal/Alertness: Awake/alert Behavior During Therapy: WFL for tasks assessed/performed Overall Cognitive Status: Within Functional Limits for tasks assessed                                       General Comments       Exercises     Shoulder Instructions      Home Living Family/patient expects to be discharged to:: Private residence Living Arrangements: Spouse/significant other Available Help at Discharge: Family Type of Home: House Home Access: Stairs to enter Secretary/administrator of Steps: 8 Entrance  Stairs-Rails: Right;Left Home Layout: One level               Home Equipment: BSC/3in1;Crutches          Prior Functioning/Environment Prior Level of Function : Independent/Modified Independent;Driving;Working/employed                        OT Problem List:        OT Treatment/Interventions:      OT Goals(Current goals can be found in the care plan section) Acute Rehab OT Goals Patient Stated Goal: To regain independence OT Goal Formulation: With patient Time For Goal Achievement: 07/13/22 Potential to Achieve Goals: Good  OT Frequency: Min 2X/week    Co-evaluation              AM-PAC OT "6 Clicks" Daily Activity     Outcome Measure Help from another person eating meals?: None Help from another person taking care of personal grooming?: None Help from another person toileting, which includes using toliet, bedpan, or urinal?: A Little Help from another person bathing (including washing, rinsing, drying)?: A Lot Help from another person to put on and taking off regular upper body clothing?: None Help from another person to put on and taking off regular lower body clothing?: A Lot 6 Click Score: 19   End of Session    Activity Tolerance: Patient tolerated treatment well Patient left: in chair;with call bell/phone within reach;with chair alarm set;with family/visitor present                   Time: 4098-1191 OT Time Calculation (min): 23 min Charges:  OT General Charges $OT Visit: 1 Visit OT Evaluation $OT Eval Moderate Complexity: 1 Mod  Olegario Messier, MS, OTR/L   Olegario Messier 06/29/2022, 12:15 PM

## 2022-06-29 NOTE — Progress Notes (Signed)
Spoke with the patient Cody Huang lives at home with his wife, she provides transportation Cody Huang needs a rolling walker and 3 in 1 [employer] will deliver to the room Cody Huang can afford his medication Cody Huang is set up with [facility] for [facility] services

## 2022-06-29 NOTE — Progress Notes (Signed)
Patient was given verbal  and written discharge instructions, acknowledges understanding and states he will comply,a honeycomb dressing was given.

## 2022-06-29 NOTE — Progress Notes (Cosign Needed)
Patient is not able to walk the distance required to go the bathroom, or he/she is unable to safely negotiate stairs required to access the bathroom.  A 3in1 BSC will alleviate this problem  

## 2022-06-29 NOTE — Progress Notes (Signed)
Physical Therapy Treatment Patient Details Name: Cody Huang MRN: [id number] DOB: [birth year] Today's Date: 06/29/2022   History of Present Illness Cody Huang is a 51yoM who comes to [facility] on 06/28/22 for elective Rt TKA with Dr. Francesco Huang. PTA pt was still driving, working, walking as tolerated without device, but very limited in pain by end of most days.    PT Comments    Patient alert, agreeable to PT RN in room just finished administering pain medication. He was able to perform a few heel slides (AAROM for both rounds, very limited flexion noted). Sit <> stand with RW and supervision, 1 verbal cue for hand placement, good carry over. He ambulated ~227ft total with CGA and RW, antalgic gait on RLE and minimal knee flexion noted on swing phase. Stair navigation performed CGA for safety, pt able to verbalize safe technique and was able to problem solve home set up with PT. PT also reviewed car transfers as well as pet management at end of session. Pt sitting up in recliner with 8/10 R knee pain with breakfast set up, RN notified. The patient would benefit from further skilled PT intervention to continue to progress towards goals. Recommendation remains appropriate.     Recommendations for follow up therapy are one component of a multi-disciplinary discharge planning process, led by the attending physician.  Recommendations may be updated based on patient status, additional functional criteria and insurance authorization.  Follow Up Recommendations  Follow physician's recommendations for discharge plan and follow up therapies     Assistance Recommended at Discharge Set up Supervision/Assistance  Patient can return home with the following Help with stairs or ramp for entrance;Assist for transportation;Assistance with cooking/housework   Equipment Recommendations  Rolling walker (2 wheels);BSC/3in1    Recommendations for Other Services       Precautions / Restrictions  Precautions Precautions: Fall Restrictions Weight Bearing Restrictions: Yes RLE Weight Bearing: Weight bearing as tolerated     Mobility  Bed Mobility               General bed mobility comments: up in chair at start/end of session    Transfers Overall transfer level: Needs assistance Equipment used: Rolling walker (2 wheels) Transfers: Sit to/from Stand Sit to Stand: Supervision                Ambulation/Gait Ambulation/Gait assistance: Min guard Gait Distance (Feet): 220 Feet Assistive device: Rolling walker (2 wheels) Gait Pattern/deviations: Antalgic       General Gait Details: little to no knee flexion noted in RLE with gait   Stairs Stairs: Yes Stairs assistance: Min guard Stair Management: One rail Right, One rail Left, Two rails, Step to pattern Number of Stairs: 8 General stair comments: ascended with L rail, descended with bilateral rails. performed again with bilateral rails, able to teach back technique, CGA   Wheelchair Mobility    Modified Rankin (Stroke Patients Only)       Balance Overall balance assessment: Modified Independent                                          Cognition Arousal/Alertness: Awake/alert Behavior During Therapy: WFL for tasks assessed/performed Overall Cognitive Status: Within Functional Limits for tasks assessed  Exercises Total Joint Exercises Ankle Circles/Pumps: AROM, Both, 10 reps Heel Slides: AAROM, Right, 10 reps, Supine Hip ABduction/ADduction: AAROM, Right, Supine, AROM, 5 reps Goniometric ROM: 10-50degrees    General Comments        Pertinent Vitals/Pain Pain Assessment Pain Assessment: 0-10 Pain Score: 7  Pain Location: Rt knee Pain Descriptors / Indicators: Aching Pain Intervention(s): Limited activity within patient's tolerance, Monitored during session, Premedicated before session, Repositioned, RN gave  pain meds during session, Ice applied    Home Living                          Prior Function            PT Goals (current goals can now be found in the care plan section) Progress towards PT goals: Progressing toward goals    Frequency    BID      PT Plan Current plan remains appropriate    Co-evaluation              AM-PAC PT "6 Clicks" Mobility   Outcome Measure  Help needed turning from your back to your side while in a flat bed without using bedrails?: A Little Help needed moving from lying on your back to sitting on the side of a flat bed without using bedrails?: A Little Help needed moving to and from a bed to a chair (including a wheelchair)?: A Little Help needed standing up from a chair using your arms (e.g., wheelchair or bedside chair)?: A Little Help needed to walk in hospital room?: A Little Help needed climbing 3-5 steps with a railing? : A Little 6 Click Score: 18    End of Session Equipment Utilized During Treatment: Gait belt Activity Tolerance: Patient tolerated treatment well;No increased pain Patient left: in chair;with family/visitor present;with call bell/phone within reach Nurse Communication: Mobility status PT Visit Diagnosis: Unsteadiness on feet (R26.81);Difficulty in walking, not elsewhere classified (R26.2);Other abnormalities of gait and mobility (R26.89);Muscle weakness (generalized) (M62.81)     Time: 3875-6433 PT Time Calculation (min) (ACUTE ONLY): 42 min  Charges:  $Gait Training: 23-37 mins $Therapeutic Exercise: 8-22 mins                     Olga Coaster PT, DPT 10:22 AM,06/29/22

## 2022-06-29 NOTE — Plan of Care (Signed)
  Problem: Education: Goal: Knowledge of General Education information will improve Description: Including pain rating scale, medication(s)/side effects and non-pharmacologic comfort measures Outcome: Progressing   Problem: Health Behavior/Discharge Planning: Goal: Ability to manage health-related needs will improve Outcome: Progressing   Problem: Clinical Measurements: Goal: Will remain free from infection Outcome: Progressing   Problem: Coping: Goal: Level of anxiety will decrease Outcome: Progressing   Problem: Nutrition: Goal: Adequate nutrition will be maintained Outcome: Progressing   Problem: Pain Managment: Goal: General experience of comfort will improve Outcome: Progressing   Problem: Safety: Goal: Ability to remain free from injury will improve Outcome: Progressing   Problem: Skin Integrity: Goal: Risk for impaired skin integrity will decrease Outcome: Progressing   

## 2022-06-29 NOTE — Progress Notes (Signed)
  Subjective: 1 Day Post-Op Procedure(s) (LRB): COMPUTER ASSISTED TOTAL KNEE ARTHROPLASTY (Right) Patient reports pain as moderate.   Patient is well, and has had no acute complaints or problems Plan is to go Home after hospital stay. Negative for chest pain and shortness of breath Fever: no Gastrointestinal:Negative for nausea and vomiting  Objective: Vital signs in last 24 hours: Temp:  [97.5 F (36.4 C)-98.6 F (37 C)] 97.8 F (36.6 C) (07/08 0906) Pulse Rate:  [69-89] 69 (07/08 0906) Resp:  [12-21] 16 (07/08 0906) BP: (107-158)/(65-95) 140/88 (07/08 0906) SpO2:  [92 %-99 %] 96 % (07/08 0906)  Intake/Output from previous day:  Intake/Output Summary (Last 24 hours) at 06/29/2022 0946 Last data filed at 06/29/2022 5409 Gross per 24 hour  Intake 3540 ml  Output 1460 ml  Net 2080 ml    Intake/Output this shift: No intake/output data recorded.  Labs: No results for input(s): "HGB" in the last 72 hours. No results for input(s): "WBC", "RBC", "HCT", "PLT" in the last 72 hours. No results for input(s): "NA", "K", "CL", "CO2", "BUN", "CREATININE", "GLUCOSE", "CALCIUM" in the last 72 hours. No results for input(s): "LABPT", "INR" in the last 72 hours.   EXAM General - Patient is Alert, Appropriate, and Oriented Extremity - ABD soft Neurovascular intact Dorsiflexion/Plantar flexion intact No cellulitis present Dressing/Incision - Bulky dressing removed, hemovac removed without issue.  4x4 with new tegaderm applied. Motor Function - intact, moving foot and toes well on exam.  Abdomen soft with intact bowel sounds.  Past Medical History:  Diagnosis Date   Anxiety    Arthritis    BIL KNEES   Asthma    WELL CONTROLLED   Depression    GERD (gastroesophageal reflux disease)    OCC   Hypertension    Migraines     Assessment/Plan: 1 Day Post-Op Procedure(s) (LRB): COMPUTER ASSISTED TOTAL KNEE ARTHROPLASTY (Right) Principal Problem:   Total knee replacement  status  Estimated body mass index is 31.75 kg/m as calculated from the following:   Height as of this encounter: 6\' 3"  (1.905 m).   Weight as of this encounter: 115.2 kg. Advance diet Up with therapy D/C IV fluids when tolerating po intake.  Vitals reviewed this AM. Hemovac with mild bloody drainage.  Removed without issue this morning. Patient has cleared all goals with PT. Patient is urinating and is passing gas without pain. Plan for discharge home this afternoon.  DVT Prophylaxis - Lovenox and TED hose Weight-Bearing as tolerated to right leg  J. , PA-C [facility] Orthopaedic Surgery 06/29/2022, 9:46 AM

## 2022-06-29 NOTE — Progress Notes (Signed)
Patient is waiting on equipment to be delivered to be discharged.

## 2022-06-29 NOTE — Discharge Summary (Signed)
Physician Discharge Summary  Patient ID: Cody Huang MRN: [id number] DOB/AGE: 1971-03-17 51 y.o.  Admit date: 06/28/2022 Discharge date: 06/29/2022  Admission Diagnoses:  Total knee replacement status [Z96.659] Primary degenerative arthrosis of the right knee  Discharge Diagnoses: Patient Active Problem List   Diagnosis Date Noted   Mild intermittent asthma 06/28/2022   Total knee replacement status 06/28/2022   Obesity (BMI 30.0-34.9) 05/04/2018   Primary osteoarthritis of left knee 05/04/2018   Primary osteoarthritis of right knee 05/04/2018   Depression, major, in remission (HCC) 10/09/2014   Chronic back pain 07/17/2014   HTN (hypertension), benign 07/17/2014    Past Medical History:  Diagnosis Date   Anxiety    Arthritis    BIL KNEES   Asthma    WELL CONTROLLED   Depression    GERD (gastroesophageal reflux disease)    OCC   Hypertension    Migraines      Transfusion: None.   Consultants (if any):   Discharged Condition: Improved  Hospital Course: Cody Huang is an 51 y.o. male who was admitted 06/28/2022 with a diagnosis of primary degenerative arthrosis of the right knee and went to the operating room on 06/28/2022 and underwent the above named procedures.    Surgeries: Procedure(s): COMPUTER ASSISTED TOTAL KNEE ARTHROPLASTY on 06/28/2022 Patient tolerated the surgery well. Taken to PACU where she was stabilized and then transferred to the orthopedic floor.  Started on Lovenox 30mg  q 12 hrs. Foot pumps applied bilaterally at 80 mm. Heels elevated on bed with rolled towels. No evidence of DVT. Negative Homan. Physical therapy started on day #1 for gait training and transfer. OT started day #1 for ADL and assisted devices.  Patient performed extremely well with therapy and was able to clear all goals needed for discharge.  Patient's IV and hemovac were removed on POD1.  Foley removed in the OR following surgery.  Implants: DePuy Attune size 8 posterior  stabilized femoral component (cemented), size 8 rotating platform tibial component (cemented), 41 mm medialized dome patella (cemented), and a 5 mm stabilized rotating platform polyethylene insert.  He was given perioperative antibiotics:  Anti-infectives (From admission, onward)    Start     Dose/Rate Route Frequency Ordered Stop   06/28/22 1400  ceFAZolin (ANCEF) IVPB 2g/100 mL premix        2 g 200 mL/hr over 30 Minutes Intravenous Every 6 hours 06/28/22 1313 06/28/22 2347   06/28/22 0629  ceFAZolin (ANCEF) 2-4 GM/100ML-% IVPB       Note to Pharmacy: 08/29/22 W: cabinet override      06/28/22 0629 06/28/22 0808   06/28/22 0600  ceFAZolin (ANCEF) IVPB 2g/100 mL premix        2 g 200 mL/hr over 30 Minutes Intravenous On call to O.R. 06/27/22 2309 06/28/22 08/29/22     .  He was given sequential compression devices, early ambulation, and Lovenox for DVT prophylaxis.  He benefited maximally from the hospital stay and there were no complications.    Recent vital signs:  Vitals:   06/29/22 0259 06/29/22 0906  BP: 107/67 140/88  Pulse: 85 69  Resp: 15 16  Temp: 98.2 F (36.8 C) 97.8 F (36.6 C)  SpO2: 97% 96%   Recent laboratory studies:  Lab Results  Component Value Date   HGB 15.6 06/18/2022   Lab Results  Component Value Date   WBC 5.3 06/18/2022   PLT 135 (L) 06/18/2022   No results found for: "INR" Lab Results  Component Value Date   NA 139 06/18/2022   K 4.5 06/18/2022   CL 104 06/18/2022   CO2 25 06/18/2022   BUN 12 06/18/2022   CREATININE 1.00 06/18/2022   GLUCOSE 110 (H) 06/18/2022   Discharge Medications:   Allergies as of 06/29/2022       Reactions   Penicillins Hives   IgE = 8 (WNL) on 06/18/2022 Has patient had a PCN reaction causing immediate rash, facial/tongue/throat swelling, SOB or lightheadedness with hypotension: No Has patient had a PCN reaction causing severe rash involving mucus membranes or skin necrosis: No Has patient had a PCN  reaction that required hospitalization: No Has patient had a PCN reaction occurring within the last 10 years: No If all of the above answers are "NO", then may proceed with Cephalosporin use.   Aspirin Rash   PT HAS TAKEN THIS SINCE AND HAS HAD NO REACTION (REPORTED ON 07-14-18)        Medication List     STOP taking these medications    ibuprofen 200 MG tablet Commonly known as: ADVIL   meloxicam 15 MG tablet Commonly known as: MOBIC       TAKE these medications    acetaminophen 500 MG tablet Commonly known as: TYLENOL Take 1-2 tablets (500-1,000 mg total) by mouth every 6 (six) hours as needed for mild pain.   albuterol 108 (90 Base) MCG/ACT inhaler Commonly known as: VENTOLIN HFA Inhale 1-2 puffs into the lungs every 6 (six) hours as needed for wheezing or shortness of breath.   budesonide-formoterol 80-4.5 MCG/ACT inhaler Commonly known as: SYMBICORT Inhale 2 puffs into the lungs 2 (two) times daily as needed (wheezing).   celecoxib 200 MG capsule Commonly known as: CELEBREX Take 1 capsule (200 mg total) by mouth 2 (two) times daily.   enoxaparin 40 MG/0.4ML injection Commonly known as: LOVENOX Inject 0.4 mLs (40 mg total) into the skin daily.   loratadine 10 MG tablet Commonly known as: CLARITIN Take 10 mg by mouth daily.   metoprolol tartrate 50 MG tablet Commonly known as: LOPRESSOR Take 100 mg by mouth every morning.   ondansetron 4 MG tablet Commonly known as: ZOFRAN Take 1 tablet (4 mg total) by mouth every 6 (six) hours as needed for nausea.   oxyCODONE 5 MG immediate release tablet Commonly known as: Oxy IR/ROXICODONE Take 1-2 tablets (5-10 mg total) by mouth every 4 (four) hours as needed for severe pain.   oxymetazoline 0.05 % nasal spray Commonly known as: AFRIN Place 1 spray into both nostrils 2 (two) times daily as needed for congestion.   sildenafil 20 MG tablet Commonly known as: REVATIO Take 20 mg by mouth daily as needed (2-5 tabs  1 hour prior to sex).   tiZANidine 4 MG tablet Commonly known as: Zanaflex Take 1 tablet (4 mg total) by mouth every 6 (six) hours as needed for muscle spasms.   traMADol 50 MG tablet Commonly known as: ULTRAM Take 1-2 tablets (50-100 mg total) by mouth every 4 (four) hours as needed for moderate pain.               Durable Medical Equipment  (From admission, onward)           Start     Ordered   06/28/22 1314  DME Walker rolling  Once       Question:  Patient needs a walker to treat with the following condition  Answer:  Total knee replacement status   06/28/22 1313  06/28/22 1314  DME Bedside commode  Once       Question:  Patient needs a bedside commode to treat with the following condition  Answer:  Total knee replacement status   06/28/22 1313           Diagnostic Studies: DG Knee Right Port  Result Date: 06/28/2022 CLINICAL DATA:  Status post right knee arthroplasty EXAM: PORTABLE RIGHT KNEE - 1-2 VIEW COMPARISON:  None Available. FINDINGS: Status post right knee total arthroplasty with expected overlying postoperative change. No evidence of perihardware fracture or component malpositioning. IMPRESSION: Status post right knee total arthroplasty with expected overlying postoperative change. No evidence of perihardware fracture or component malpositioning. Electronically Signed   By: Jearld Lesch M.D.   On: 06/28/2022 12:09    Disposition: Plan for discharge home with HHPT this afternoon.   Follow-up Information     Madelyn Flavors, PA-C Follow up on 07/12/2022.   Specialty: Orthopedic Surgery Why: at 9:45am Contact information: [address] [facility] [place] [address] [phone]         Donato Heinz, MD Follow up on 08/08/2022.   Specialty: Orthopedic Surgery Why: at 2:45pm Contact information: [address] [facility] [facility] [address] [facility] [place] [address] [phone]                 Signed: Meriel Pica PA-C 06/29/2022, 9:53 AM

## 2022-06-30 NOTE — Anesthesia Postprocedure Evaluation (Signed)
Anesthesia Post Note  Patient: Cody Huang  Procedure(s) Performed: COMPUTER ASSISTED TOTAL KNEE ARTHROPLASTY (Right: Knee)  Patient location during evaluation: PACU Anesthesia Type: General Level of consciousness: awake and alert Pain management: pain level controlled Vital Signs Assessment: post-procedure vital signs reviewed and stable Respiratory status: spontaneous breathing, nonlabored ventilation, respiratory function stable and patient connected to nasal cannula oxygen Cardiovascular status: blood pressure returned to baseline and stable Postop Assessment: no apparent nausea or vomiting Anesthetic complications: no   No notable events documented.   Last Vitals:  Vitals:   06/29/22 0906 06/29/22 1241  BP: 140/88 121/82  Pulse: 69 63  Resp: 16 16  Temp: 36.6 C 36.4 C  SpO2: 96% 100%    Last Pain:  Vitals:   06/29/22 1331  TempSrc:   PainSc: 7                  Lenard Simmer

## 2022-07-01 ENCOUNTER — Encounter: Payer: Self-pay | Admitting: Orthopedic Surgery

## 2022-08-16 NOTE — Discharge Instructions (Signed)
Instructions after Total Knee Replacement   Alechia Lezama P. Azariyah Luhrs, Jr., M.D.     Dept. of Orthopaedics & Sports Medicine  [facility]  [address]  [place], [place]  [address]  Phone: [phone]   Fax: [phone]    DIET: Drink plenty of non-alcoholic fluids. Resume your normal diet. Include foods high in fiber.  ACTIVITY:  You may use crutches or a walker with weight-bearing as tolerated, unless instructed otherwise. You may be weaned off of the walker or crutches by your Physical Therapist.  Do NOT place pillows under the knee. Anything placed under the knee could limit your ability to straighten the knee.   Continue doing gentle exercises. Exercising will reduce the pain and swelling, increase motion, and prevent muscle weakness.   Please continue to use the TED compression stockings for 6 weeks. You may remove the stockings at night, but should reapply them in the morning. Do not drive or operate any equipment until instructed.  WOUND CARE:  Continue to use the PolarCare or ice packs periodically to reduce pain and swelling. You may bathe or shower after the staples are removed at the first office visit following surgery.  MEDICATIONS: You may resume your regular medications. Please take the pain medication as prescribed on the medication. Do not take pain medication on an empty stomach. You have been given a prescription for a blood thinner (Lovenox or Coumadin). Please take the medication as instructed. (NOTE: After completing a 2 week course of Lovenox, take one Enteric-coated aspirin once a day. This along with elevation will help reduce the possibility of phlebitis in your operated leg.) Do not drive or drink alcoholic beverages when taking pain medications.  CALL THE OFFICE FOR: Temperature above 101 degrees Excessive bleeding or drainage on the dressing. Excessive swelling, coldness, or paleness of the toes. Persistent nausea and vomiting.  FOLLOW-UP:  You  should have an appointment to return to the office in 10-14 days after surgery. Arrangements have been made for continuation of Physical Therapy (either home therapy or outpatient therapy).    [facility] Department Directory         [email or web address]       [email or web address]          Cardiology  Appointments: [place] - [phone] [place] - [phone]  Endocrinology  Appointments: [place] - [phone] [place] - [phone]  Gastroenterology  Appointments: [place] - [phone] [place] - [phone]        General Surgery   Appointments: [place] - [phone]  Internal Medicine/Family Medicine  Appointments: [place] - [phone] [place] - [phone] [place] - [phone]  Metabolic and Weigh Loss Surgery  Appointments: [place] - [phone]        Neurology  Appointments: [place] - [phone] [place] - [phone]  Neurosurgery  Appointments: [place] - [phone]  Obstetrics & Gynecology  Appointments: [place] - [phone] [place] - [phone]        Pediatrics  Appointments: [place] - [phone] [place] - [phone]  Physiatry  Appointments: [place] -[phone]  Physical Therapy  Appointments: [place] - [phone] [place] - [phone]        Podiatry  Appointments: [place] - [phone] [place] - [phone]  Pulmonology  Appointments: [place] - [phone]  Rheumatology  Appointments: [place] - [phone]        [place] Location: [facility]  [address] , [place]  [address]  [place] Location: [facility] [address] [place], [place]  [address]  [place] Location: [facility] [address] [place], [place]  [address]       AMBULATORY SURGERY  DISCHARGE INSTRUCTIONS   The drugs that you were given will stay in your system until tomorrow so for the next 24 hours you should not:  Drive an  automobile Make any legal decisions Drink any alcoholic beverage   You may resume regular meals tomorrow.  Today it is better to start with liquids and gradually work up to solid foods.  You may eat anything you prefer, but it is better to start with liquids, then soup and crackers, and gradually work up to solid foods.   Please notify your doctor immediately if you have any unusual bleeding, trouble breathing, redness and pain at the surgery site, drainage, fever, or pain not relieved by medication.    Additional Instructions:  Please contact your physician with any problems or Same Day Surgery at [phone], Monday through Friday 6 am to 4 pm, or [place] [facility] [place] Main number at [phone].  

## 2022-08-20 ENCOUNTER — Other Ambulatory Visit: Payer: Self-pay

## 2022-08-20 ENCOUNTER — Encounter
Admission: RE | Admit: 2022-08-20 | Discharge: 2022-08-20 | Disposition: A | Discharge status: Home / Self Care | Payer: Commercial Managed Care - PPO | Source: Ambulatory Visit | Attending: Orthopedic Surgery | Admitting: Orthopedic Surgery

## 2022-08-20 MED ORDER — CHLORHEXIDINE GLUCONATE 0.12 % MT SOLN: 15.0000 mL | Freq: Once | OROMUCOSAL | Status: AC

## 2022-08-20 MED ORDER — CELECOXIB 200 MG PO CAPS: 400.0000 mg | ORAL_CAPSULE | Freq: Once | ORAL | Status: AC

## 2022-08-20 MED ORDER — FAMOTIDINE 20 MG PO TABS: 20.0000 mg | ORAL_TABLET | Freq: Once | ORAL | Status: AC

## 2022-08-20 MED ORDER — LACTATED RINGERS IV SOLN: INTRAVENOUS | Status: DC

## 2022-08-20 MED ORDER — ORAL CARE MOUTH RINSE: 15.0000 mL | Freq: Once | OROMUCOSAL | Status: AC

## 2022-08-20 NOTE — Patient Instructions (Signed)
Your procedure is scheduled on: 08/21/22 Report to DAY SURGERY DEPARTMENT LOCATED ON 2ND FLOOR MEDICAL MALL ENTRANCE. To find out your arrival time please call [phone] between 1PM - 3PM on 08/20/22.  Remember: Instructions that are not followed completely may result in serious medical risk, up to and including death, or upon the discretion of your surgeon and anesthesiologist your surgery may need to be rescheduled.     _X__ 1. Do not eat food after midnight the night before your procedure.                 No gum chewing or hard candies. You may drink clear liquids up to 2 hours                 before you are scheduled to arrive for your surgery- DO not drink clear                 liquids within 2 hours of the start of your surgery.                 Clear Liquids include:  water, apple juice without pulp, clear carbohydrate                 drink such as Clearfast or Gatorade, Black Coffee or Tea (Do not add                 anything to coffee or tea). Diabetics water only  __X__2.  On the morning of surgery brush your teeth with toothpaste and water, you                 may rinse your mouth with mouthwash if you wish.  Do not swallow any              toothpaste of mouthwash.     _X__ 3.  No Alcohol for 24 hours before or after surgery.   _X__ 4.  Do Not Smoke or use e-cigarettes For 24 Hours Prior to Your Surgery.                 Do not use any chewable tobacco products for at least 6 hours prior to                 surgery.  ____  5.  Bring all medications with you on the day of surgery if instructed.   __X__  6.  Notify your doctor if there is any change in your medical condition      (cold, fever, infections).     Do not wear jewelry, make-up, hairpins, clips or nail polish. Do not wear lotions, powders, or perfumes.  Do not shave body hair 48 hours prior to surgery. Men may shave face and neck. Do not bring valuables to the hospital.    [facility] is not responsible for any  belongings or valuables.  Contacts, dentures/partials or body piercings may not be worn into surgery. Bring a case for your contacts, glasses or hearing aids, a denture cup will be supplied. Leave your suitcase in the car. After surgery it may be brought to your room. For patients admitted to the hospital, discharge time is determined by your treatment team.   Patients discharged the day of surgery will not be allowed to drive home.    __X__ Take these medicines the morning of surgery with A SIP OF WATER:    1. metoprolol tartrate (LOPRESSOR) 100 MG tablet  2.   3.  4.  5.  6.  ____ Fleet Enema (as directed)   ____ Use CHG Soap/SAGE wipes as directed  __X__ Use inhalers on the day of surgery  ____ Stop metformin/Janumet/Farxiga 2 days prior to surgery    ____ Take 1/2 of usual insulin dose the night before surgery. No insulin the morning          of surgery.   ____ Stop Blood Thinners Coumadin/Plavix/Xarelto/Pleta/Pradaxa/Eliquis/Effient/Aspirin  on   Or contact your Surgeon, Cardiologist or Medical Doctor regarding  ability to stop your blood thinners  __X__ Stop Anti-inflammatories 7 days before surgery such as Advil, Ibuprofen, Motrin,  BC or Goodies Powder, Naprosyn, Naproxen, Aleve, Aspirin    __X__ Stop all herbals and supplements, fish oil or vitamins  until after surgery.    ____ Bring C-Pap to the hospital.

## 2022-08-21 ENCOUNTER — Encounter: Payer: Self-pay | Admitting: Orthopedic Surgery

## 2022-08-21 ENCOUNTER — Other Ambulatory Visit: Payer: Self-pay

## 2022-08-21 ENCOUNTER — Ambulatory Visit: Payer: Commercial Managed Care - PPO | Admitting: Anesthesiology

## 2022-08-21 ENCOUNTER — Ambulatory Visit
Admission: RE | Admit: 2022-08-21 | Discharge: 2022-08-21 | Disposition: A | Discharge status: Home / Self Care | Payer: Commercial Managed Care - PPO | Attending: Orthopedic Surgery | Admitting: Orthopedic Surgery

## 2022-08-21 ENCOUNTER — Encounter
Admission: RE | Disposition: A | Discharge status: Home / Self Care | Payer: Self-pay | Source: Home / Self Care | Attending: Orthopedic Surgery

## 2022-08-21 DIAGNOSIS — Z96651 Presence of right artificial knee joint: Secondary | ICD-10-CM | POA: Insufficient documentation

## 2022-08-21 DIAGNOSIS — Z9889 Other specified postprocedural states: Secondary | ICD-10-CM

## 2022-08-21 DIAGNOSIS — M24661 Ankylosis, right knee: Secondary | ICD-10-CM | POA: Diagnosis present

## 2022-08-21 DIAGNOSIS — I10 Essential (primary) hypertension: Secondary | ICD-10-CM | POA: Diagnosis not present

## 2022-08-21 DIAGNOSIS — K219 Gastro-esophageal reflux disease without esophagitis: Secondary | ICD-10-CM | POA: Diagnosis not present

## 2022-08-21 DIAGNOSIS — J45909 Unspecified asthma, uncomplicated: Secondary | ICD-10-CM | POA: Insufficient documentation

## 2022-08-21 DIAGNOSIS — M1990 Unspecified osteoarthritis, unspecified site: Secondary | ICD-10-CM | POA: Diagnosis not present

## 2022-08-21 HISTORY — PX: KNEE CLOSED REDUCTION: SHX995

## 2022-08-21 SURGERY — MANIPULATION, KNEE, CLOSED
Anesthesia: General | Site: Knee | Laterality: Right

## 2022-08-21 MED ORDER — FENTANYL CITRATE (PF) 100 MCG/2ML IJ SOLN: 25.0000 ug | INTRAMUSCULAR | Status: DC | PRN

## 2022-08-21 MED ORDER — CHLORHEXIDINE GLUCONATE 0.12 % MT SOLN
OROMUCOSAL | Status: AC
  Administered 2022-08-21: 15 mL via OROMUCOSAL
  Filled 2022-08-21: qty 15

## 2022-08-21 MED ORDER — MORPHINE SULFATE (PF) 2 MG/ML IV SOLN: 0.5000 mg | INTRAVENOUS | Status: DC | PRN

## 2022-08-21 MED ORDER — FAMOTIDINE 20 MG PO TABS
ORAL_TABLET | ORAL | Status: AC
  Administered 2022-08-21: 20 mg via ORAL
  Filled 2022-08-21: qty 1

## 2022-08-21 MED ORDER — ACETAMINOPHEN 10 MG/ML IV SOLN
1000.0000 mg | Freq: Once | INTRAVENOUS | Status: AC
Start: 2022-08-21 — End: 2022-08-21
  Administered 2022-08-21: 1000 mg via INTRAVENOUS

## 2022-08-21 MED ORDER — SODIUM CHLORIDE 0.9 % IV SOLN: INTRAVENOUS | Status: DC

## 2022-08-21 MED ORDER — HYDROCODONE-ACETAMINOPHEN 5-325 MG PO TABS: 1.0000 | ORAL_TABLET | Freq: Four times a day (QID) | ORAL | 0 refills | Status: AC | PRN

## 2022-08-21 MED ORDER — CELECOXIB 200 MG PO CAPS
ORAL_CAPSULE | ORAL | Status: AC
  Administered 2022-08-21: 400 mg via ORAL
  Filled 2022-08-21: qty 2

## 2022-08-21 MED ORDER — HYDROCODONE-ACETAMINOPHEN 5-325 MG PO TABS: 1.0000 | ORAL_TABLET | ORAL | Status: DC | PRN

## 2022-08-21 MED ORDER — METOCLOPRAMIDE HCL 5 MG/ML IJ SOLN: 5.0000 mg | Freq: Three times a day (TID) | INTRAMUSCULAR | Status: DC | PRN

## 2022-08-21 MED ORDER — ONDANSETRON HCL 4 MG/2ML IJ SOLN: 4.0000 mg | Freq: Four times a day (QID) | INTRAMUSCULAR | Status: DC | PRN

## 2022-08-21 MED ORDER — HYDROCODONE-ACETAMINOPHEN 7.5-325 MG PO TABS: 1.0000 | ORAL_TABLET | ORAL | Status: DC | PRN

## 2022-08-21 MED ORDER — METOCLOPRAMIDE HCL 10 MG PO TABS: 5.0000 mg | ORAL_TABLET | Freq: Three times a day (TID) | ORAL | Status: DC | PRN

## 2022-08-21 MED ORDER — ONDANSETRON HCL 4 MG PO TABS: 4.0000 mg | ORAL_TABLET | Freq: Four times a day (QID) | ORAL | Status: DC | PRN

## 2022-08-21 MED ORDER — ACETAMINOPHEN 325 MG PO TABS: 325.0000 mg | ORAL_TABLET | Freq: Four times a day (QID) | ORAL | Status: DC | PRN

## 2022-08-21 MED ORDER — SUCCINYLCHOLINE CHLORIDE 200 MG/10ML IV SOSY
PREFILLED_SYRINGE | INTRAVENOUS | Status: DC | PRN
  Administered 2022-08-21: 120 mg via INTRAVENOUS
  Administered 2022-08-21: 40 mg via INTRAVENOUS

## 2022-08-21 MED ORDER — PROPOFOL 10 MG/ML IV BOLUS
INTRAVENOUS | Status: DC | PRN
  Administered 2022-08-21: 250 mg via INTRAVENOUS
  Administered 2022-08-21: 50 mg via INTRAVENOUS

## 2022-08-21 SURGICAL SUPPLY — 4 items
KIT TURNOVER KIT A (KITS) ×1 IMPLANT
MANIFOLD NEPTUNE II (INSTRUMENTS) ×1 IMPLANT
TRAP FLUID SMOKE EVACUATOR (MISCELLANEOUS) ×1 IMPLANT
WATER STERILE IRR 500ML POUR (IV SOLUTION) ×1 IMPLANT

## 2022-08-21 NOTE — Interval H&P Note (Signed)
History and Physical Interval Note:  08/21/2022 4:32 PM  EDMAR BLANKENBURG  has presented today for surgery, with the diagnosis of Status post total right knee replacement Z96.651 Knee stiff, right M25.661.  The various methods of treatment have been discussed with the patient and family. After consideration of risks, benefits and other options for treatment, the patient has consented to  Procedure(s): CLOSED MANIPULATION OF RIGHT KNEE UNDER ANESTHESIA (Right) as a surgical intervention.  The patient's history has been reviewed, patient examined, no change in status, stable for surgery.  I have reviewed the patient's chart and labs.  Questions were answered to the patient's satisfaction.     Marlana Mckowen P Jeniah Kishi

## 2022-08-21 NOTE — Anesthesia Procedure Notes (Signed)
Date/Time: 08/21/2022 6:38 PM  Performed by: Philbert Riser, CRNAPre-anesthesia Checklist: Patient identified, Emergency Drugs available, Suction available, Patient being monitored and Timeout performed Ventilation: Mask ventilation without difficulty

## 2022-08-21 NOTE — H&P (Signed)
ORTHOPAEDIC HISTORY & PHYSICAL Lakindra Wible, Florinda Marker., MD - 08/08/2022 2:45 PM EDT Formatting of this note is different from the original. Images from the original note were not included. Chief Complaint: Chief Complaint  Patient presents with  Post Operative Visit  6 weeks right total knee arthroplasty 06/28/22   Reason for Visit: The patient is a 51 y.o. male who returns today for reevaluation of his right knee. He is 6 weeks status post right total knee arthroplasty. He denies any problems with the surgical incision. He denies any significant pain, swelling, locking, or giving way of the knee. He is using a cane for ambulation. He has continued with physical therapy. He has made slow progress with his knee range of motion and has actually reached a plateau at approximately 90 degree of flexion. He has been extremely conscientious with his home exercise program.  Medications: Current Outpatient Medications  Medication Sig Dispense Refill  acetaminophen (TYLENOL) 500 MG tablet Take 1,000 mg by mouth every 6 (six) hours as needed  albuterol 90 mcg/actuation inhaler Inhale 2 inhalations into the lungs every 4 (four) hours as needed for Wheezing or Shortness of Breath 1 each 0  amoxicillin (AMOXIL) 500 MG capsule Take 4 capsules 1 hour before the dental procedure 4 capsule 1  budesonide-formoteroL (SYMBICORT) 80-4.5 mcg/actuation inhaler Inhale 2 inhalations into the lungs 2 (two) times daily 10.2 g 12  celecoxib (CELEBREX) 200 MG capsule Take 200 mg by mouth 2 (two) times daily  ibuprofen (MOTRIN) 200 MG tablet Take 800 mg by mouth 2 (two) times daily  loratadine (CLARITIN) 10 mg tablet Take 10 mg by mouth once daily  metoprolol tartrate (LOPRESSOR) 50 MG tablet TAKE 1 TABLET(50 MG) BY MOUTH TWICE DAILY 180 tablet 1  ondansetron (ZOFRAN) 4 MG tablet Take 4 mg by mouth every 6 (six) hours as needed  oxymetazoline (AFRIN) 0.05 % nasal spray Place 1 spray into both nostrils once daily as needed  for Congestion  oxymetazoline (AFRIN) 0.05 % nasal spray Place into one nostril  sildenafil (REVATIO) 20 mg tablet Take by mouth  sildenafiL (VIAGRA) 50 MG tablet Take 50 mg by mouth once daily as needed for Erectile Dysfunction  traMADoL (ULTRAM) 50 mg tablet Take 1 tablet (50 mg total) by mouth every 6 (six) hours as needed for Pain for up to 30 doses 30 tablet 0   No current facility-administered medications for this visit.   Allergies: Allergies  Allergen Reactions  Aspirin Hives  Penicillins Rash and Hives  Bradycardia  IgE = 8 (WNL) on 06/18/2022  Has patient had a PCN reaction causing immediate rash, facial/tongue/throat swelling, SOB or lightheadedness with hypotension: No Has patient had a PCN reaction causing severe rash involving mucus membranes or skin necrosis: No Has patient had a PCN reaction that required hospitalization: No Has patient had a PCN reaction occurring within the last 10 years: No If all of the above answers are "NO", then may proceed with Cephalosporin use.   Past Medical History: Past Medical History:  Diagnosis Date  Anxiety  Back pain, chronic  Chicken pox  Hypertension  Migraines  Mild intermittent asthma  Osteoarthritis  Panic attacks  Seasonal allergies  Splenomegaly   Past Surgical History: Past Surgical History:  Procedure Laterality Date  Left knee arthroscopy 07/15/2018  Dr Hardie Lora Jefm Bryant  Right total knee arthroplasty using computer-assisted navigation 06/28/2022  Dr Marry Guan  hand surgery  Left big toe surgery   Social History: Social History   Socioeconomic History  Marital  status: Married  Spouse name: Toniann Fail  Number of children: 1  Years of education: 16  Highest education level: Bachelor's degree (e.g., BA, AB, BS)  Occupational History  Occupation: Full-time- Conservation officer, historic buildings  Tobacco Use  Smoking status: Never  Smokeless tobacco: Current  Types: Chew  Tobacco comments:  chews 1/2 can per day 20+ years  Vaping  Use  Vaping Use: Never used  Substance and Sexual Activity  Alcohol use: Yes  Alcohol/week: 4.0 - 6.0 standard drinks  Types: 4 - 6 Cans of beer per week  Comment: 4-6 cans of beer  Drug use: Defer  Sexual activity: Yes  Partners: Female  Social History Narrative  Married, works in Holiday [employer].   Family History: Family History  Problem Relation Age of Onset  No Known Problems Mother  No Known Problems Father   Review of Systems: A comprehensive 14 point ROS was performed, reviewed, and the pertinent orthopaedic findings are documented in the HPI.  Exam BP 126/88  Ht 188 cm (6\' 2" )  Wt (!) 104.5 kg (230 lb 6.4 oz)  BMI 29.58 kg/m   General:  Well-developed, well-nourished male seen in no acute distress.  Stiff legged gait without significant varus or valgus thrust to the right knee.  Right Knee: Soft tissue swelling: mild Effusion: none Erythema: none Crepitance: none Tenderness: global Alignment: normal Mediolateral laxity: stable Patellar tracking: Good tracking without evidence of subluxation or tilt. Fair patellar mobility. Atrophy: No significant atrophy. Quadriceps tone was fair to good. Range of motion: 0/5/91 degrees  Vascular: Peripheral pulses are palpable. Good capillary refill. No gross pretibial or ankle edema. Homans test is negative.  Neurologic:  Awake, alert, and oriented.  Sensory function is intact to pinprick and light touch.  Motor strength is judged to be 5/5.  Motor coordination is grossly within normal limits.  No apparent clonus. No tremor.   Radiographs: I ordered and interpreted standing AP, lateral, and sunrise views of the right knee that were obtained in the office today. Good position of the total knee implants. Good alignment is noted on the AP view. Good cement mantle is appreciated without evidence of loosening. No evidence of polyethylene wear or osteolysis. No evidence of fracture or dislocation.   Impression: Right total  knee arthroplasty Arthrofibrosis of the right knee  Plan:  The findings were discussed in detail with the patient. Notes from physical therapy were reviewed.  We discussed my concerns about the limited range of motion despite aggressive physical therapy. I explained the option of manipulation of the right knee under anesthesia with the immediate return to physical therapy and the possible use of a CPM machine. The patient expressed his understanding of this recommendation but wanted to try 2-3 more sessions of physical therapy before committing to MUA. The importance of a conscientious home exercise program was reviewed with the patient.  I spent a total of 30 minutes in both face-to-face and non-face-to-face activities, excluding procedures performed, for this visit on the date of this encounter.  SPECIAL EQUIPMENT: None SPECIAL STUDIES: None ACTIVITIES:  As tolerated. WORK STATUS: Out of work until reevaluated. THERAPY: Physical therapy for range of motion. Home exercise program as reviewed with the patient. MEDICATIONS: Requested Prescriptions   Signed Prescriptions Disp Refills  traMADoL (ULTRAM) 50 mg tablet 30 tablet 0  Sig: Take 1 tablet (50 mg total) by mouth every 6 (six) hours as needed for Pain for up to 30 doses   FOLLOW-UP: Return pending next week's physical therapy progress.  Shiquan Mathieu P. Angie Fava., M.D.  This note was generated in part with voice recognition software and I apologize for any typographical errors that were not detected and corrected.  Electronically signed by Shari Heritage., MD at 08/11/2022 1:44 PM EDT  ADDENDUM: The patient completed several additional sessions of physical therapy without significant improvement of his range of motion. The patient agreed with recommendations to proceed with manipulation of the right knee under anesthesia.  Lungs: Clear to auscultation. Cardiovascular: Regular rate and rhythm.  Raciel Caffrey P. Angie Fava  M.D.

## 2022-08-21 NOTE — Op Note (Signed)
OPERATIVE NOTE  DATE OF SURGERY:  08/21/2022  PATIENT NAME:  Cody Huang   DOB: 18-May-1971  MRN: [id number]   PRE-OPERATIVE DIAGNOSIS: Arthrofibrosis of the right knee status post right total knee arthroplasty  POST-OPERATIVE DIAGNOSIS:  Same  PROCEDURE: Manipulation of the right knee under anesthesia  SURGEON:  Jena Gauss., M.D.   ASSISTANT: None  ANESTHESIA: general  ESTIMATED BLOOD LOSS: None  FLUIDS REPLACED: 300 mL of crystalloid  INDICATIONS FOR SURGERY: [name] is a 51 y.o. year old male who underwent right total knee arthroplasty approximately 7 weeks ago.  Despite aggressive physical therapy as well as a conscientious home exercise program, the patient had persistent limitation of his knee range of motion.. After discussion of the risks and benefits of surgical intervention, the patient expressed understanding of the risks benefits and agree with plans for manipulation of the right knee under anesthesia.   PROCEDURE IN DETAIL: The patient was brought into the operating room and a timeout was performed as per usual protocol. After adequate general anesthesia to provide for complete muscle relaxation, the patient's right knee was flexed and measured to be 83 degrees.  The patient's upper right tibia was rested against my shoulder and I applied my ear against the lateral aspect of the right knee to listen for audible lysis of adhesions.  Using gentle force on the upper tibia, audible lysis of adhesions could be demonstrated with gradual improvement of his knee flexion.  After completion of the manipulation, measurements were obtained and 120 degrees of flexion could be achieved.  The patient tolerated the procedure well.  He was transported to the recovery room in stable condition.   Adalis Gatti P. Angie Fava M.D.

## 2022-08-21 NOTE — Transfer of Care (Addendum)
Immediate Anesthesia Transfer of Care Note  Patient: Jadene Pierini  Procedure(s) Performed: CLOSED MANIPULATION OF RIGHT KNEE UNDER ANESTHESIA (Right: Knee)  Patient Location: PACU  Anesthesia Type:General  Level of Consciousness: drowsy  Airway & Oxygen Therapy: Patient Spontanous Breathing and Patient connected to face mask oxygen  Post-op Assessment: Report given to RN and Post -op Vital signs reviewed and stable  Post vital signs: Reviewed and stable  Last Vitals:  Vitals Value Taken Time  BP    Temp    Pulse    Resp    SpO2      Last Pain:  Vitals:   08/21/22 1552  TempSrc: Temporal         Complications: No notable events documented.

## 2022-08-21 NOTE — Anesthesia Preprocedure Evaluation (Signed)
Anesthesia Evaluation  Patient identified by MRN, date of birth, ID band Patient awake    Reviewed: Allergy & Precautions, H&P , NPO status , Patient's Chart, lab work & pertinent test results  History of Anesthesia Complications Negative for: history of anesthetic complications  Airway Mallampati: III  TM Distance: <3 FB Neck ROM: full    Dental  (+) Chipped, Poor Dentition, Dental Advidsory Given   Pulmonary neg shortness of breath, asthma , neg sleep apnea, neg recent URI,           Cardiovascular Exercise Tolerance: Good hypertension, (-) angina(-) Past MI and (-) DOE (-) dysrhythmias (-) Valvular Problems/Murmurs     Neuro/Psych  Headaches, neg Seizures PSYCHIATRIC DISORDERS Anxiety Depression    GI/Hepatic Neg liver ROS, GERD  Medicated and Controlled,  Endo/Other  negative endocrine ROS  Renal/GU negative Renal ROS     Musculoskeletal  (+) Arthritis ,   Abdominal   Peds  Hematology negative hematology ROS (+)   Anesthesia Other Findings Past Medical History: No date: Anxiety No date: Arthritis     Comment:  BIL KNEES No date: Asthma     Comment:  WELL CONTROLLED No date: Depression No date: GERD (gastroesophageal reflux disease)     Comment:  OCC No date: Headache     Comment:  H/O MIGRAINES No date: Hypertension  Past Surgical History: No date: FINGER SURGERY No date: THUMB SURGERY No date: TOE SURGERY  BMI    Body Mass Index:  31.75 kg/m      Reproductive/Obstetrics negative OB ROS                             Anesthesia Physical  Anesthesia Plan  ASA: 3  Anesthesia Plan: General   Post-op Pain Management:    Induction: Intravenous  PONV Risk Score and Plan: 2 and Propofol infusion and TIVA  Airway Management Planned: Mask  Additional Equipment:   Intra-op Plan:   Post-operative Plan:   Informed Consent: I have reviewed the patients History and  Physical, chart, labs and discussed the procedure including the risks, benefits and alternatives for the proposed anesthesia with the patient or authorized representative who has indicated his/her understanding and acceptance.     Dental Advisory Given  Plan Discussed with: Anesthesiologist, CRNA and Surgeon  Anesthesia Plan Comments: (Patient consented for risks of anesthesia including but not limited to:  - adverse reactions to medications - damage to teeth, lips or other oral mucosa - sore throat or hoarseness - Damage to heart, brain, lungs or loss of life  Patient voiced understanding.)        Anesthesia Quick Evaluation

## 2022-08-22 ENCOUNTER — Encounter: Payer: Self-pay | Admitting: Orthopedic Surgery

## 2022-08-23 NOTE — Anesthesia Postprocedure Evaluation (Signed)
Anesthesia Post Note  Patient: Cody Huang  Procedure(s) Performed: CLOSED MANIPULATION OF RIGHT KNEE UNDER ANESTHESIA (Right: Knee)  Patient location during evaluation: PACU Anesthesia Type: General Level of consciousness: awake and alert Pain management: pain level controlled Vital Signs Assessment: post-procedure vital signs reviewed and stable Respiratory status: spontaneous breathing, nonlabored ventilation, respiratory function stable and patient connected to nasal cannula oxygen Cardiovascular status: blood pressure returned to baseline and stable Postop Assessment: no apparent nausea or vomiting Anesthetic complications: no   No notable events documented.   Last Vitals:  Vitals:   08/21/22 1917 08/21/22 1930  BP: (!) 144/80 (!) 171/95  Pulse: 77 80  Resp: 14 17  Temp: (!) 36.1 C 36.9 C  SpO2: 100% 98%    Last Pain:  Vitals:   08/21/22 1930  TempSrc: Temporal  PainSc: 3                  Lenard Simmer

## 2022-11-15 ENCOUNTER — Encounter: Payer: Self-pay | Admitting: Orthopedic Surgery

## 2022-11-15 NOTE — Addendum Note (Signed)
Addendum  created 11/15/22 1341 by Stormy Fabian, CRNA   Intraprocedure Event edited

## 2023-05-22 ENCOUNTER — Ambulatory Visit: Payer: Self-pay
